# Patient Record
Sex: Female | Born: 1970 | Race: White | Hispanic: No | Marital: Single | Smoking: Never smoker
Health system: Southern US, Community
[De-identification: ages and names within clinical notes are randomized; demographics above are authoritative.]

## PROBLEM LIST (undated history)

## (undated) DIAGNOSIS — R011 Cardiac murmur, unspecified: Secondary | ICD-10-CM

## (undated) DIAGNOSIS — L509 Urticaria, unspecified: Secondary | ICD-10-CM

## (undated) DIAGNOSIS — T783XXA Angioneurotic edema, initial encounter: Secondary | ICD-10-CM

## (undated) DIAGNOSIS — I10 Essential (primary) hypertension: Secondary | ICD-10-CM

## (undated) HISTORY — DX: Cardiac murmur, unspecified: R01.1

## (undated) HISTORY — DX: Angioneurotic edema, initial encounter: T78.3XXA

## (undated) HISTORY — PX: TYMPANOSTOMY TUBE PLACEMENT: SHX32

## (undated) HISTORY — PX: OTHER SURGICAL HISTORY: SHX169

## (undated) HISTORY — DX: Urticaria, unspecified: L50.9

## (undated) HISTORY — DX: Essential (primary) hypertension: I10

---

## 2001-04-23 ENCOUNTER — Inpatient Hospital Stay (HOSPITAL_COMMUNITY): Admission: AD | Admit: 2001-04-23 | Discharge: 2001-04-23 | Payer: Self-pay | Admitting: Obstetrics and Gynecology

## 2001-05-18 ENCOUNTER — Other Ambulatory Visit: Admission: RE | Admit: 2001-05-18 | Discharge: 2001-05-18 | Payer: Self-pay | Admitting: Obstetrics and Gynecology

## 2001-09-17 ENCOUNTER — Ambulatory Visit (HOSPITAL_COMMUNITY): Admission: RE | Admit: 2001-09-17 | Discharge: 2001-09-17 | Payer: Self-pay | Admitting: Obstetrics and Gynecology

## 2001-12-02 ENCOUNTER — Inpatient Hospital Stay (HOSPITAL_COMMUNITY): Admission: AD | Admit: 2001-12-02 | Discharge: 2001-12-05 | Payer: Self-pay | Admitting: Obstetrics and Gynecology

## 2001-12-14 ENCOUNTER — Encounter: Admission: RE | Admit: 2001-12-14 | Discharge: 2002-01-13 | Payer: Self-pay | Admitting: Obstetrics and Gynecology

## 2002-01-04 ENCOUNTER — Other Ambulatory Visit: Admission: RE | Admit: 2002-01-04 | Discharge: 2002-01-04 | Payer: Self-pay | Admitting: Obstetrics and Gynecology

## 2003-02-09 ENCOUNTER — Other Ambulatory Visit: Admission: RE | Admit: 2003-02-09 | Discharge: 2003-02-09 | Payer: Self-pay | Admitting: Obstetrics and Gynecology

## 2004-04-12 ENCOUNTER — Other Ambulatory Visit: Admission: RE | Admit: 2004-04-12 | Discharge: 2004-04-12 | Payer: Self-pay | Admitting: Obstetrics and Gynecology

## 2005-09-01 ENCOUNTER — Other Ambulatory Visit: Admission: RE | Admit: 2005-09-01 | Discharge: 2005-09-01 | Payer: Self-pay | Admitting: Obstetrics and Gynecology

## 2009-10-13 ENCOUNTER — Emergency Department (HOSPITAL_COMMUNITY): Admission: EM | Admit: 2009-10-13 | Discharge: 2009-10-13 | Payer: Self-pay | Admitting: Emergency Medicine

## 2010-08-27 LAB — URINALYSIS, ROUTINE W REFLEX MICROSCOPIC
Ketones, ur: NEGATIVE mg/dL
Leukocytes, UA: NEGATIVE
Nitrite: NEGATIVE
Protein, ur: NEGATIVE mg/dL

## 2010-08-27 LAB — POCT I-STAT, CHEM 8
Chloride: 107 mEq/L (ref 96–112)
Creatinine, Ser: 0.7 mg/dL (ref 0.4–1.2)
Glucose, Bld: 121 mg/dL — ABNORMAL HIGH (ref 70–99)
HCT: 43 % (ref 36.0–46.0)
Potassium: 3.3 mEq/L — ABNORMAL LOW (ref 3.5–5.1)
Sodium: 142 mEq/L (ref 135–145)

## 2012-02-29 ENCOUNTER — Encounter (HOSPITAL_COMMUNITY): Payer: Self-pay | Admitting: *Deleted

## 2012-02-29 ENCOUNTER — Emergency Department (HOSPITAL_COMMUNITY)
Admission: EM | Admit: 2012-02-29 | Discharge: 2012-02-29 | Disposition: A | Discharge status: Home / Self Care | Payer: 59 | Attending: Emergency Medicine | Admitting: Emergency Medicine

## 2012-02-29 DIAGNOSIS — I10 Essential (primary) hypertension: Secondary | ICD-10-CM

## 2012-02-29 DIAGNOSIS — R42 Dizziness and giddiness: Secondary | ICD-10-CM | POA: Insufficient documentation

## 2012-02-29 DIAGNOSIS — R110 Nausea: Secondary | ICD-10-CM | POA: Insufficient documentation

## 2012-02-29 DIAGNOSIS — R209 Unspecified disturbances of skin sensation: Secondary | ICD-10-CM | POA: Insufficient documentation

## 2012-02-29 LAB — COMPREHENSIVE METABOLIC PANEL
ALT: 11 U/L (ref 0–35)
AST: 16 U/L (ref 0–37)
Alkaline Phosphatase: 51 U/L (ref 39–117)
CO2: 22 mEq/L (ref 19–32)
Calcium: 9.2 mg/dL (ref 8.4–10.5)
Chloride: 103 mEq/L (ref 96–112)
GFR calc Af Amer: >90 mL/min (ref >90)
GFR calc non Af Amer: >90 mL/min (ref >90)
Glucose, Bld: 106 mg/dL — ABNORMAL HIGH (ref 70–99)
Sodium: 137 mEq/L (ref 135–145)
Total Bilirubin: 0.3 mg/dL (ref 0.3–1.2)

## 2012-02-29 LAB — CBC WITH DIFFERENTIAL/PLATELET
Eosinophils Relative: 3 % (ref 0–5)
HCT: 40.3 % (ref 36.0–46.0)
Lymphocytes Relative: 29 % (ref 12–46)
Lymphs Abs: 2.5 10*3/uL (ref 0.7–4.0)
MCV: 82.4 fL (ref 78.0–100.0)
Neutro Abs: 5.3 10*3/uL (ref 1.7–7.7)
Platelets: 260 10*3/uL (ref 150–400)
RBC: 4.89 MIL/uL (ref 3.87–5.11)
WBC: 8.6 10*3/uL (ref 4.0–10.5)

## 2012-02-29 LAB — URINALYSIS, ROUTINE W REFLEX MICROSCOPIC
Bilirubin Urine: NEGATIVE
Hgb urine dipstick: NEGATIVE
Ketones, ur: NEGATIVE mg/dL
Nitrite: NEGATIVE
Protein, ur: NEGATIVE mg/dL
Urobilinogen, UA: 0.2 mg/dL (ref 0.0–1.0)

## 2012-02-29 LAB — TROPONIN I: Troponin I: <0.3 ng/mL (ref <0.30)

## 2012-02-29 MED ORDER — MECLIZINE HCL 25 MG PO TABS
25.0000 mg | ORAL_TABLET | Freq: Once | ORAL | Status: AC
  Administered 2012-02-29: 25 mg via ORAL
  Filled 2012-02-29: qty 1

## 2012-02-29 MED ORDER — MECLIZINE HCL 25 MG PO TABS: 25.0000 mg | ORAL_TABLET | Freq: Four times a day (QID) | ORAL | Status: DC

## 2012-02-29 NOTE — ED Notes (Signed)
Pt states that  She woke at 4 am today with dizziness and left arm numbness  B/p is high in triage 192/116  Hr 118

## 2012-02-29 NOTE — ED Notes (Signed)
Pt ambulated independently to D/C window. Shows no signs of acute distress.

## 2012-02-29 NOTE — ED Notes (Signed)
Pt bilateral grips strong.  Pt smile equal symmetrical.  Pt able to ambulate without difficulty.  Pt denies SOB

## 2012-02-29 NOTE — ED Provider Notes (Signed)
History     CSN: [id number]  Arrival date & time 02/29/12  0516   First MD Initiated Contact with Patient 02/29/12 [phone]      Chief Complaint  Patient presents with  . Numbness  . Dizziness  . Nausea    (Consider location/radiation/quality/duration/timing/severity/associated sxs/prior treatment) The history is provided by the patient.   Patient here complaining of dizziness which occurred when she awoke at approximately 4:00 this morning. Denies any chest pain or chest pressure. No recent vomiting or diarrhea. No fevers. Dizziness was worse with certain positions. Denies any ear pain or recent cold symptoms. Also noted some neck pain with radiation to her left arm which was positional. Similar episode earlier this week also worse with movement. No associated anginal type symptoms. No medications used prior to arrival. No past medical history on file.  No past surgical history on file.  No family history on file.  History  Substance Use Topics  . Smoking status: Never Smoker   . Smokeless tobacco: Not on file  . Alcohol Use: No    OB History    Grav Para Term Preterm Abortions TAB SAB Ect Mult Living                  Review of Systems  All other systems reviewed and are negative.    Allergies  Codeine  Home Medications   Current Outpatient Rx  Name Route Sig Dispense Refill  . VITAMIN D 1000 UNITS PO TABS Oral Take 1,000 Units by mouth daily.      BP 140/90  Pulse 80  Temp 98.2 F (36.8 C) (Oral)  Resp 20  Ht 5\' 3"  (1.6 m)  Wt 212 lb (96.163 kg)  BMI 37.55 kg/m2  SpO2 96%  LMP 02/16/2012  Physical Exam  Nursing note and vitals reviewed. Constitutional: She is oriented to person, place, and time. She appears well-developed and well-nourished.  Non-toxic appearance. No distress.  HENT:  Head: Normocephalic and atraumatic.  Eyes: Conjunctivae normal, EOM and lids are normal. Pupils are equal, round, and reactive to light.  Neck: Normal range of  motion. Neck supple. No tracheal deviation present. No mass present.  Cardiovascular: Normal rate, regular rhythm and normal heart sounds.  Exam reveals no gallop.   No murmur heard. Pulmonary/Chest: Effort normal and breath sounds normal. No stridor. No respiratory distress. She has no decreased breath sounds. She has no wheezes. She has no rhonchi. She has no rales.  Abdominal: Soft. Normal appearance and bowel sounds are normal. She exhibits no distension. There is no tenderness. There is no rebound and no CVA tenderness.  Musculoskeletal: Normal range of motion. She exhibits no edema and no tenderness.  Neurological: She is alert and oriented to person, place, and time. She has normal strength. No cranial nerve deficit or sensory deficit. Coordination and gait normal. GCS eye subscore is 4. GCS verbal subscore is 5. GCS motor subscore is 6.  Skin: Skin is warm and dry. No abrasion and no rash noted.  Psychiatric: She has a normal mood and affect. Her speech is normal and behavior is normal.    ED Course  Procedures (including critical care time)  Labs Reviewed  COMPREHENSIVE METABOLIC PANEL - Abnormal; Notable for the following:    Glucose, Bld 106 (*)     All other components within normal limits  TROPONIN I  CBC WITH DIFFERENTIAL  CBC  BASIC METABOLIC PANEL  URINALYSIS, ROUTINE W REFLEX MICROSCOPIC   No results found.  No diagnosis found.    MDM  Pt given anitvert and feels better, pt possibly has peripheral vertigo vs htn-will f/u her pcp for repeat bp checks--neuro exam stable at time of discharge--repeat bp improved        Toy Baker, MD 02/29/12 [phone]

## 2013-03-04 ENCOUNTER — Other Ambulatory Visit: Payer: Self-pay | Admitting: Obstetrics and Gynecology

## 2013-03-04 DIAGNOSIS — R928 Other abnormal and inconclusive findings on diagnostic imaging of breast: Secondary | ICD-10-CM

## 2013-03-15 ENCOUNTER — Ambulatory Visit
Admission: RE | Admit: 2013-03-15 | Discharge: 2013-03-15 | Disposition: A | Discharge status: Home / Self Care | Payer: 59 | Source: Ambulatory Visit | Attending: Obstetrics and Gynecology | Admitting: Obstetrics and Gynecology

## 2013-03-15 DIAGNOSIS — R928 Other abnormal and inconclusive findings on diagnostic imaging of breast: Secondary | ICD-10-CM

## 2014-02-21 ENCOUNTER — Other Ambulatory Visit: Payer: Self-pay | Admitting: Obstetrics and Gynecology

## 2014-02-21 DIAGNOSIS — R921 Mammographic calcification found on diagnostic imaging of breast: Secondary | ICD-10-CM

## 2014-03-16 ENCOUNTER — Ambulatory Visit
Admission: RE | Admit: 2014-03-16 | Discharge: 2014-03-16 | Disposition: A | Discharge status: Home / Self Care | Payer: 59 | Source: Ambulatory Visit | Attending: Obstetrics and Gynecology | Admitting: Obstetrics and Gynecology

## 2014-03-16 ENCOUNTER — Encounter (INDEPENDENT_AMBULATORY_CARE_PROVIDER_SITE_OTHER): Payer: Self-pay

## 2014-03-16 DIAGNOSIS — R921 Mammographic calcification found on diagnostic imaging of breast: Secondary | ICD-10-CM

## 2015-02-15 ENCOUNTER — Other Ambulatory Visit: Payer: Self-pay | Admitting: Obstetrics and Gynecology

## 2015-02-15 DIAGNOSIS — R921 Mammographic calcification found on diagnostic imaging of breast: Secondary | ICD-10-CM

## 2015-03-29 ENCOUNTER — Ambulatory Visit
Admission: RE | Admit: 2015-03-29 | Discharge: 2015-03-29 | Disposition: A | Discharge status: Home / Self Care | Payer: 59 | Source: Ambulatory Visit | Attending: Obstetrics and Gynecology | Admitting: Obstetrics and Gynecology

## 2015-03-29 DIAGNOSIS — R921 Mammographic calcification found on diagnostic imaging of breast: Secondary | ICD-10-CM

## 2015-07-06 ENCOUNTER — Ambulatory Visit (INDEPENDENT_AMBULATORY_CARE_PROVIDER_SITE_OTHER): Payer: Worker's Compensation | Admitting: Urgent Care

## 2015-07-06 ENCOUNTER — Ambulatory Visit: Payer: Worker's Compensation

## 2015-07-06 VITALS — BP 156/96 | HR 79 | Temp 98.1°F | Resp 16 | Ht 63.75 in | Wt 137.8 lb

## 2015-07-06 DIAGNOSIS — M79661 Pain in right lower leg: Secondary | ICD-10-CM

## 2015-07-06 DIAGNOSIS — R030 Elevated blood-pressure reading, without diagnosis of hypertension: Secondary | ICD-10-CM | POA: Diagnosis not present

## 2015-07-06 DIAGNOSIS — S86811A Strain of other muscle(s) and tendon(s) at lower leg level, right leg, initial encounter: Secondary | ICD-10-CM | POA: Diagnosis not present

## 2015-07-06 MED ORDER — CYCLOBENZAPRINE HCL 5 MG PO TABS: 5.0000 mg | ORAL_TABLET | Freq: Three times a day (TID) | ORAL | Status: DC | PRN

## 2015-07-06 MED ORDER — NAPROXEN SODIUM 550 MG PO TABS: 550.0000 mg | ORAL_TABLET | Freq: Two times a day (BID) | ORAL | Status: DC

## 2015-07-06 NOTE — Progress Notes (Signed)
    MRN: [id number] DOB: Jul 12, 1970  Subjective:   Ashley Turner is a 45 y.o. female presenting for chief complaint of Leg Pain  Reports right injury while at work today. Patient works in Scientist, research (medical), Statistician. She reports that she stepped off the curb while receiving a truck shipment. Patient felt tear and pulling in her right calf. She has since had throbbing pain in her calf up to her thigh, is worse with walking, has been limping. Denies swelling, redness, hearing loud popping noise. Denies smoking cigarettes.  Madelene's medications list, allergies, past medical history, past surgical history, family history were reviewed and excluded from this note due to being a worker's comp case.  Objective:   Vitals: BP 156/96 mmHg  Pulse 79  Temp(Src) 98.1 F (36.7 C) (Oral)  Resp 16  Ht 5' 3.75" (1.619 m)  Wt 137 lb 12.8 oz (62.506 kg)  BMI 23.85 kg/m2  SpO2 98%  LMP 06/20/2015  Physical Exam  Constitutional: She is oriented to person, place, and time. She appears well-developed and well-nourished.  HENT:  Mouth/Throat: Oropharynx is clear and moist.  Eyes: Right eye exhibits no discharge. Left eye exhibits no discharge. No scleral icterus.  Neck: Normal range of motion. Neck supple. No thyromegaly present.  Cardiovascular: Normal rate, regular rhythm and intact distal pulses.  Exam reveals no gallop and no friction rub.   No murmur heard. Pulmonary/Chest: No respiratory distress. She has no wheezes. She has no rales.  Musculoskeletal: She exhibits tenderness (over right calf with deep palpation). She exhibits no edema.       Right knee: She exhibits normal range of motion, no swelling, no effusion, no ecchymosis, no deformity, no erythema, no LCL laxity and normal patellar mobility. No tenderness found.  Neurological: She is alert and oriented to person, place, and time. She has normal reflexes. Coordination (limping, favoring her right leg) abnormal.  Skin: Skin is warm and dry. No  rash noted. No erythema. No pallor.  Psychiatric: She has a normal mood and affect.   Dg Tibia/fibula Right  07/06/2015  CLINICAL DATA:  Right lower leg pain common no known injury EXAM: RIGHT TIBIA AND FIBULA - 2 VIEW COMPARISON:  None in PACs FINDINGS: The right tibia and fibula are adequately mineralized. There is no lytic or blastic lesion. There is no acute or healing fracture. The overlying soft tissues exhibit no abnormalities. The observed portions of the right knee in the right ankle are normal. IMPRESSION: There is no acute or chronic bony abnormality of the right tibia or fibula. The overlying soft tissues are unremarkable. Electronically Signed   By: David  Martinique M.D.   On: 07/06/2015 11:06   Assessment and Plan :   1. Strain of calf muscle, right, initial encounter 2. Pain of right lower leg - X-ray findings and physical exam reassuring. Low suspicion of DVT. Will start conservative management with Anaprox and Flexeril. Patient to rtc in 2 weeks if no improvement, consider referral to ortho at that point.  3. Elevated blood pressure reading without diagnosis of hypertension - Recommended patient set up annual exam or at the very least a recheck for her elevated blood pressure reading to r/o HTN. ROS is negative including lightheadedness, dizziness, chronic headache, double vision, chest pain, shortness of breath, heart racing, palpitations, nausea, vomiting, abdominal pain, hematuria.   Jaynee Eagles, PA-C [facility] [phone] 07/06/2015 10:44 AM

## 2015-07-06 NOTE — Patient Instructions (Addendum)
Because you received an x-ray today, you will receive an invoice from [facility]. Please contact [facility] at [phone] with questions or concerns regarding your invoice. Our billing staff will not be able to assist you with those questions.    Muscle Strain A muscle strain is an injury that occurs when a muscle is stretched beyond its normal length. Usually a small number of muscle fibers are torn when this happens. Muscle strain is rated in degrees. First-degree strains have the least amount of muscle fiber tearing and pain. Second-degree and third-degree strains have increasingly more tearing and pain.  Usually, recovery from muscle strain takes 1-2 weeks. Complete healing takes 5-6 weeks.  CAUSES  Muscle strain happens when a sudden, violent force placed on a muscle stretches it too far. This may occur with lifting, sports, or a fall.  RISK FACTORS Muscle strain is especially common in athletes.  SIGNS AND SYMPTOMS At the site of the muscle strain, there may be:  Pain.  Bruising.  Swelling.  Difficulty using the muscle due to pain or lack of normal function. DIAGNOSIS  Your health care provider will perform a physical exam and ask about your medical history. TREATMENT  Often, the best treatment for a muscle strain is resting, icing, and applying cold compresses to the injured area.  HOME CARE INSTRUCTIONS   Use the PRICE method of treatment to promote muscle healing during the first 2-3 days after your injury. The PRICE method involves:  Protecting the muscle from being injured again.  Restricting your activity and resting the injured body part.  Icing your injury. To do this, put ice in a plastic bag. Place a towel between your skin and the bag. Then, apply the ice and leave it on from 15-20 minutes each hour. After the third day, switch to moist heat packs.  Apply compression to the injured area with a splint or elastic bandage. Be careful not to wrap  it too tightly. This may interfere with blood circulation or increase swelling.  Elevate the injured body part above the level of your heart as often as you can.  Only take over-the-counter or prescription medicines for pain, discomfort, or fever as directed by your health care provider.  Warming up prior to exercise helps to prevent future muscle strains. SEEK MEDICAL CARE IF:   You have increasing pain or swelling in the injured area.  You have numbness, tingling, or a significant loss of strength in the injured area. MAKE SURE YOU:   Understand these instructions.  Will watch your condition.  Will get help right away if you are not doing well or get worse.   This information is not intended to replace advice given to you by your health care provider. Make sure you discuss any questions you have with your health care provider.   Document Released: 05/26/2005 Document Revised: 03/16/2013 Document Reviewed: 12/23/2012 Elsevier Interactive Patient Education [employer].

## 2015-09-04 ENCOUNTER — Ambulatory Visit (INDEPENDENT_AMBULATORY_CARE_PROVIDER_SITE_OTHER): Payer: 59 | Admitting: Family Medicine

## 2015-09-04 VITALS — BP 160/94 | HR 80 | Temp 97.7°F | Resp 20 | Ht 64.57 in | Wt 140.2 lb

## 2015-09-04 DIAGNOSIS — I10 Essential (primary) hypertension: Secondary | ICD-10-CM

## 2015-09-04 MED ORDER — AMLODIPINE BESYLATE 5 MG PO TABS: 5.0000 mg | ORAL_TABLET | Freq: Every day | ORAL | Status: DC

## 2015-09-04 NOTE — Progress Notes (Signed)
This is a 45 year old woman who works for a [employer] education [employer]), and is returning to have her blood pressure rechecked. She had a calf problem a couple months ago which is healing. She hasn't returned to running yet.   Patient has stopped taking Naprosyn. She has a family history of hypertension with her sister and both parents. She has no symptoms of hypertension such as headache, chest pain, or swelling in the legs.  Patient has had lab tests in the past which have been normal.  Objective:BP 160/94 mmHg  Pulse 80  Temp(Src) 97.7 F (36.5 C) (Oral)  Resp 20  Ht 5' 4.57" (1.64 m)  Wt 140 lb 3.2 oz (63.594 kg)  BMI 23.64 kg/m2  SpO2 98%  LMP 08/20/2015 Blood pressure recheck is 164/94 Heart: Regular, no murmur or gallop Chest: Clear Extremity: No edema  Assessment: Essential hypertension   This chart was scribed in my presence and reviewed by me personally.    ICD-9-CM ICD-10-CM   1. Essential hypertension 401.9 I10 amLODipine (NORVASC) 5 MG tablet     Signed, Robyn Haber, MD

## 2015-09-04 NOTE — Patient Instructions (Addendum)
Monitor your blood pressure. Our goal is to get your blood pressure below 140/90. He will get lightheaded if your blood pressure gets below 90/60. If that happens, discontinue the medication and return for consideration of another blood pressure medicine. He usually takes one month for the blood pressure to completely returned to normal after starting amlodipine     Hypertension Hypertension, commonly called high blood pressure, is when the force of blood pumping through your arteries is too strong. Your arteries are the blood vessels that carry blood from your heart throughout your body. A blood pressure reading consists of a higher number over a lower number, such as 110/72. The higher number (systolic) is the pressure inside your arteries when your heart pumps. The lower number (diastolic) is the pressure inside your arteries when your heart relaxes. Ideally you want your blood pressure below 120/80. Hypertension forces your heart to work harder to pump blood. Your arteries may become narrow or stiff. Having untreated or uncontrolled hypertension can cause heart attack, stroke, kidney disease, and other problems. RISK FACTORS Some risk factors for high blood pressure are controllable. Others are not.  Risk factors you cannot control include:   Race. You may be at higher risk if you are African American.  Age. Risk increases with age.  Gender. Men are at higher risk than women before age 42 years. After age 65, women are at higher risk than men. Risk factors you can control include:  Not getting enough exercise or physical activity.  Being overweight.  Getting too much fat, sugar, calories, or salt in your diet.  Drinking too much alcohol. SIGNS AND SYMPTOMS Hypertension does not usually cause signs or symptoms. Extremely high blood pressure (hypertensive crisis) may cause headache, anxiety, shortness of breath, and nosebleed. DIAGNOSIS To check if you have hypertension, your health  care provider will measure your blood pressure while you are seated, with your arm held at the level of your heart. It should be measured at least twice using the same arm. Certain conditions can cause a difference in blood pressure between your right and left arms. A blood pressure reading that is higher than normal on one occasion does not mean that you need treatment. If it is not clear whether you have high blood pressure, you may be asked to return on a different day to have your blood pressure checked again. Or, you may be asked to monitor your blood pressure at home for 1 or more weeks. TREATMENT Treating high blood pressure includes making lifestyle changes and possibly taking medicine. Living a healthy lifestyle can help lower high blood pressure. You may need to change some of your habits. Lifestyle changes may include:  Following the DASH diet. This diet is high in fruits, vegetables, and whole grains. It is low in salt, red meat, and added sugars.  Keep your sodium intake below 2,300 mg per day.  Getting at least 30-45 minutes of aerobic exercise at least 4 times per week.  Losing weight if necessary.  Not smoking.  Limiting alcoholic beverages.  Learning ways to reduce stress. Your health care provider may prescribe medicine if lifestyle changes are not enough to get your blood pressure under control, and if one of the following is true:  You are 53-75 years of age and your systolic blood pressure is above 140.  You are 53 years of age or older, and your systolic blood pressure is above 150.  Your diastolic blood pressure is above 90.  You have  diabetes, and your systolic blood pressure is over [id number] or your diastolic blood pressure is over 90.  You have kidney disease and your blood pressure is above 140/90.  You have heart disease and your blood pressure is above 140/90. Your personal target blood pressure may vary depending on your medical conditions, your age, and other  factors. HOME CARE INSTRUCTIONS  Have your blood pressure rechecked as directed by your health care provider.   Take medicines only as directed by your health care provider. Follow the directions carefully. Blood pressure medicines must be taken as prescribed. The medicine does not work as well when you skip doses. Skipping doses also puts you at risk for problems.  Do not smoke.   Monitor your blood pressure at home as directed by your health care provider. SEEK MEDICAL CARE IF:   You think you are having a reaction to medicines taken.  You have recurrent headaches or feel dizzy.  You have swelling in your ankles.  You have trouble with your vision. SEEK IMMEDIATE MEDICAL CARE IF:  You develop a severe headache or confusion.  You have unusual weakness, numbness, or feel faint.  You have severe chest or abdominal pain.  You vomit repeatedly.  You have trouble breathing. MAKE SURE YOU:   Understand these instructions.  Will watch your condition.  Will get help right away if you are not doing well or get worse.   This information is not intended to replace advice given to you by your health care provider. Make sure you discuss any questions you have with your health care provider.   Document Released: 05/26/2005 Document Revised: 10/10/2014 Document Reviewed: 03/18/2013 Elsevier Interactive Patient Education [employer].

## 2016-07-01 DIAGNOSIS — Z01419 Encounter for gynecological examination (general) (routine) without abnormal findings: Secondary | ICD-10-CM | POA: Diagnosis not present

## 2016-08-02 ENCOUNTER — Other Ambulatory Visit: Payer: Self-pay | Admitting: Family Medicine

## 2016-08-02 DIAGNOSIS — I10 Essential (primary) hypertension: Secondary | ICD-10-CM

## 2016-08-04 NOTE — Telephone Encounter (Signed)
Meds ordered this encounter  Medications  . amLODipine (NORVASC) 5 MG tablet    Sig: TAKE 1 TABLET (5 MG TOTAL) BY MOUTH DAILY.    Dispense:  90 tablet    Refill:  0    Patient notified via My Chart. Needs OV for next fill.

## 2016-11-03 ENCOUNTER — Other Ambulatory Visit: Payer: Self-pay | Admitting: Physician Assistant

## 2016-11-03 DIAGNOSIS — I10 Essential (primary) hypertension: Secondary | ICD-10-CM

## 2016-11-07 ENCOUNTER — Encounter: Payer: Self-pay | Admitting: Family Medicine

## 2016-11-07 ENCOUNTER — Ambulatory Visit (INDEPENDENT_AMBULATORY_CARE_PROVIDER_SITE_OTHER): Payer: 59 | Admitting: Family Medicine

## 2016-11-07 VITALS — BP 138/90 | HR 73 | Temp 98.8°F | Resp 16 | Ht 63.78 in | Wt 154.6 lb

## 2016-11-07 DIAGNOSIS — I10 Essential (primary) hypertension: Secondary | ICD-10-CM | POA: Diagnosis not present

## 2016-11-07 DIAGNOSIS — Z5181 Encounter for therapeutic drug level monitoring: Secondary | ICD-10-CM

## 2016-11-07 DIAGNOSIS — E663 Overweight: Secondary | ICD-10-CM

## 2016-11-07 DIAGNOSIS — Z13220 Encounter for screening for lipoid disorders: Secondary | ICD-10-CM

## 2016-11-07 DIAGNOSIS — Z131 Encounter for screening for diabetes mellitus: Secondary | ICD-10-CM | POA: Diagnosis not present

## 2016-11-07 MED ORDER — AMLODIPINE BESYLATE 5 MG PO TABS: 5.0000 mg | ORAL_TABLET | Freq: Every day | ORAL | 1 refills | Status: DC

## 2016-11-07 NOTE — Progress Notes (Signed)
  Chief Complaint  Patient presents with  . Medication Refill    amlodipine    HPI   Hypertension: Patient here for follow-up of elevated blood pressure. She is not exercising and is adherent to low salt diet.  Blood pressure is not checked at home. Cardiac symptoms none. Patient denies chest pain, dyspnea, fatigue, irregular heart beat, near-syncope and palpitations.  Cardiovascular risk factors: hypertension. Use of agents associated with hypertension: none. History of target organ damage: none.  She has been diagnosed with hypertension March 2017. BP Readings from Last 3 Encounters:  11/07/16 (!) 148/88  09/04/15 (!) 160/94  07/06/15 (!) 156/96    Lab Results  Component Value Date   CREATININE 0.68 02/29/2012   Screening for dyslipidemia She has a strong history of high cholesterol in the family She does not exercise She does not take any fish oil supplements No chest pains   Overweight She is not exercising She would like to lose some weight She is interested in a weight loss program but does not have time Body mass index is 26.72 kg/m. Wt Readings from Last 3 Encounters:  11/07/16 154 lb 9.6 oz (70.1 kg)  09/04/15 140 lb 3.2 oz (63.6 kg)  07/06/15 137 lb 12.8 oz (62.5 kg)     No past medical history on file.  Current Outpatient Prescriptions  Medication Sig Dispense Refill  . amLODipine (NORVASC) 5 MG tablet TAKE 1 TABLET (5 MG TOTAL) BY MOUTH DAILY. 90 tablet 0  . Norethin Ace-Eth Estrad-FE (TAYTULLA) 1-20 MG-MCG(24) CAPS Take by mouth.     No current facility-administered medications for this visit.     Allergies:  Allergies  Allergen Reactions  . Codeine Nausea And Vomiting    No past surgical history on file.  Social History   Social History  . Marital status: Single    Spouse name: N/A  . Number of children: N/A  . Years of education: N/A   Social History Main Topics  . Smoking status: Never Smoker  . Smokeless tobacco: Never Used  .  Alcohol use No  . Drug use: No  . Sexual activity: Not on file   Other Topics Concern  . Not on file   Social History Narrative  . No narrative on file    ROS See hpi  Objective: Vitals:   11/07/16 0822  BP: (!) 148/88  Pulse: 73  Resp: 16  Temp: 98.8 F (37.1 C)  TempSrc: Oral  SpO2: 98%  Weight: 154 lb 9.6 oz (70.1 kg)  Height: 5' 3.78" (1.62 m)    Physical Exam  Constitutional: She is oriented to person, place, and time. She appears well-developed and well-nourished.  HENT:  Head: Normocephalic and atraumatic.  Eyes: Conjunctivae and EOM are normal.  Cardiovascular: Normal rate, regular rhythm and normal heart sounds.   Pulmonary/Chest: Effort normal and breath sounds normal. No respiratory distress. She has no wheezes.  Neurological: She is alert and oriented to person, place, and time.  Skin: Skin is warm. Capillary refill takes less than 2 seconds.  Psychiatric: She has a normal mood and affect. Her behavior is normal. Judgment and thought content normal.    Assessment and Plan There are no diagnoses linked to this encounter.   [place]

## 2016-11-07 NOTE — Patient Instructions (Addendum)
How to Take Your Blood Pressure  Blood pressure is a measurement of how strongly your blood is pressing against the walls of your arteries. Arteries are blood vessels that carry blood from your heart throughout your body. Your health care provider takes your blood pressure at each office visit. You can also take your own blood pressure at home with a blood pressure machine. You may need to take your own blood pressure:   To confirm a diagnosis of high blood pressure (hypertension).   To monitor your blood pressure over time.   To make sure your blood pressure medicine is working.    Supplies needed:  To take your blood pressure, you will need a blood pressure machine. You can buy a blood pressure machine, or blood pressure monitor, at most drugstores or online. There are several types of home blood pressure monitors. When choosing one, consider the following:   Choose a monitor that has an arm cuff.   Choose a monitor that wraps snugly around your upper arm. You should be able to fit only one finger between your arm and the cuff.   Do not choose a monitor that measures your blood pressure from your wrist or finger.    Your health care provider can suggest a reliable monitor that will meet your needs.  How to prepare  To get the most accurate reading, avoid the following for 30 minutes before you check your blood pressure:   Drinking caffeine.   Drinking alcohol.   Eating.   Smoking.   Exercising.    Five minutes before you check your blood pressure:   Empty your bladder.   Sit quietly without talking in a dining chair, rather than in a soft couch or armchair.    How to take your blood pressure  To check your blood pressure, follow the instructions in the manual that came with your blood pressure monitor. If you have a digital blood pressure monitor, the instructions may be as follows:  1. Sit up straight.  2. Place your feet on the floor. Do not cross your ankles or legs.  3. Rest your left arm at the  level of your heart on a table or desk or on the arm of a chair.  4. Pull up your shirt sleeve.  5. Wrap the blood pressure cuff around the upper part of your left arm, 1 inch (2.5 cm) above your elbow. It is best to wrap the cuff around bare skin.  6. Fit the cuff snugly around your arm. You should be able to place only one finger between the cuff and your arm.  7. Position the cord inside the groove of your elbow.  8. Press the power button.  9. Sit quietly while the cuff inflates and deflates.  10. Read the digital reading on the monitor screen and write it down (record it).  11. Wait 2-3 minutes, then repeat the steps, starting at step 1.    What does my blood pressure reading mean?  A blood pressure reading consists of a higher number over a lower number. Ideally, your blood pressure should be below 120/80. The first ("top") number is called the systolic pressure. It is a measure of the pressure in your arteries as your heart beats. The second ("bottom") number is called the diastolic pressure. It is a measure of the pressure in your arteries as the heart relaxes.  Blood pressure is classified into four stages. The following are the stages for adults who do   not have a short-term serious illness or a chronic condition. Systolic pressure and diastolic pressure are measured in a unit called mm Hg.  Normal   Systolic pressure: below 120.   Diastolic pressure: below 80.  Elevated   Systolic pressure: 120-129.   Diastolic pressure: below 80.  Hypertension stage 1   Systolic pressure: 130-139.   Diastolic pressure: 80-89.  Hypertension stage 2   Systolic pressure: 140 or above.   Diastolic pressure: 90 or above.  You can have prehypertension or hypertension even if only the systolic or only the diastolic number in your reading is higher than normal.  Follow these instructions at home:   Check your blood pressure as often as recommended by your health care provider.   Take your monitor to the next appointment  with your health care provider to make sure:  ? That you are using it correctly.  ? That it provides accurate readings.   Be sure you understand what your goal blood pressure numbers are.   Tell your health care provider if you are having any side effects from blood pressure medicine.  Contact a health care provider if:   Your blood pressure is consistently high.  Get help right away if:   Your systolic blood pressure is higher than 180.   Your diastolic blood pressure is higher than 110.  This information is not intended to replace advice given to you by your health care provider. Make sure you discuss any questions you have with your health care provider.  Document Released: 11/02/2015 Document Revised: 01/15/2016 Document Reviewed: 11/02/2015  Elsevier Interactive Patient Education  2018 [employer] Inc[employer]

## 2016-11-08 LAB — LIPID PANEL
CHOLESTEROL TOTAL: 199 mg/dL (ref 100–199)
Chol/HDL Ratio: 3 ratio (ref 0.0–4.4)
HDL: 67 mg/dL (ref >39)
LDL Calculated: 121 mg/dL — ABNORMAL HIGH (ref 0–99)
TRIGLYCERIDES: 57 mg/dL (ref 0–149)
VLDL Cholesterol Cal: 11 mg/dL (ref 5–40)

## 2016-11-08 LAB — COMPREHENSIVE METABOLIC PANEL
A/G RATIO: 1.9 (ref 1.2–2.2)
ALK PHOS: 34 IU/L — AB (ref 39–117)
ALT: 8 IU/L (ref 0–32)
AST: 11 IU/L (ref 0–40)
Albumin: 4.3 g/dL (ref 3.5–5.5)
BILIRUBIN TOTAL: 0.4 mg/dL (ref 0.0–1.2)
BUN/Creatinine Ratio: 20 (ref 9–23)
BUN: 15 mg/dL (ref 6–24)
CHLORIDE: 105 mmol/L (ref 96–106)
CO2: 23 mmol/L (ref 18–29)
Calcium: 9.9 mg/dL (ref 8.7–10.2)
Creatinine, Ser: 0.76 mg/dL (ref 0.57–1.00)
GFR calc Af Amer: 110 mL/min/{1.73_m2} (ref >59)
GFR calc non Af Amer: 95 mL/min/{1.73_m2} (ref >59)
Globulin, Total: 2.3 g/dL (ref 1.5–4.5)
Glucose: 77 mg/dL (ref 65–99)
POTASSIUM: 4.2 mmol/L (ref 3.5–5.2)
Sodium: 141 mmol/L (ref 134–144)
Total Protein: 6.6 g/dL (ref 6.0–8.5)

## 2016-11-08 LAB — MICROALBUMIN, URINE: Microalbumin, Urine: 6.6 ug/mL

## 2016-11-08 LAB — HEMOGLOBIN A1C
Est. average glucose Bld gHb Est-mCnc: 103 mg/dL
Hgb A1c MFr Bld: 5.2 % (ref 4.8–5.6)

## 2017-02-17 DIAGNOSIS — Z719 Counseling, unspecified: Secondary | ICD-10-CM | POA: Diagnosis not present

## 2017-05-04 ENCOUNTER — Other Ambulatory Visit: Payer: Self-pay | Admitting: Family Medicine

## 2017-05-04 DIAGNOSIS — I10 Essential (primary) hypertension: Secondary | ICD-10-CM

## 2017-07-02 DIAGNOSIS — Z01419 Encounter for gynecological examination (general) (routine) without abnormal findings: Secondary | ICD-10-CM | POA: Diagnosis not present

## 2017-07-02 DIAGNOSIS — Z1212 Encounter for screening for malignant neoplasm of rectum: Secondary | ICD-10-CM | POA: Diagnosis not present

## 2017-07-02 DIAGNOSIS — Z6827 Body mass index (BMI) 27.0-27.9, adult: Secondary | ICD-10-CM | POA: Diagnosis not present

## 2017-08-02 ENCOUNTER — Other Ambulatory Visit: Payer: Self-pay | Admitting: Physician Assistant

## 2017-08-02 DIAGNOSIS — I10 Essential (primary) hypertension: Secondary | ICD-10-CM

## 2017-08-03 NOTE — Telephone Encounter (Signed)
Attempted to contact pt regarding prescription refill request; office visit needed for additional refills; no upcoming appointments noted; left message at [phone].

## 2017-08-06 ENCOUNTER — Ambulatory Visit: Payer: 59 | Admitting: Physician Assistant

## 2017-08-06 ENCOUNTER — Other Ambulatory Visit: Payer: Self-pay

## 2017-08-06 ENCOUNTER — Encounter: Payer: Self-pay | Admitting: Physician Assistant

## 2017-08-06 VITALS — BP 132/98 | HR 88 | Temp 98.4°F | Resp 16 | Ht 63.78 in | Wt 161.0 lb

## 2017-08-06 DIAGNOSIS — I10 Essential (primary) hypertension: Secondary | ICD-10-CM | POA: Diagnosis not present

## 2017-08-06 DIAGNOSIS — H8110 Benign paroxysmal vertigo, unspecified ear: Secondary | ICD-10-CM | POA: Diagnosis not present

## 2017-08-06 DIAGNOSIS — R011 Cardiac murmur, unspecified: Secondary | ICD-10-CM | POA: Diagnosis not present

## 2017-08-06 MED ORDER — AMLODIPINE BESYLATE 10 MG PO TABS: 10.0000 mg | ORAL_TABLET | Freq: Every day | ORAL | 3 refills | Status: DC

## 2017-08-06 NOTE — Patient Instructions (Signed)
     IF you received an x-ray today, you will receive an invoice from [facility]. Please contact [facility] at [phone] with questions or concerns regarding your invoice.   IF you received labwork today, you will receive an invoice from [facility]. Please contact [facility] at [phone] with questions or concerns regarding your invoice.   Our billing staff will not be able to assist you with questions regarding bills from these companies.  You will be contacted with the lab results as soon as they are available. The fastest way to get your results is to activate your My Chart account. Instructions are located on the last page of this paperwork. If you have not heard from us regarding the results in 2 weeks, please contact this office.     

## 2017-08-06 NOTE — Assessment & Plan Note (Signed)
New diagnosis today, patient not previously aware of any murmur. Recommend echocardiogram.

## 2017-08-06 NOTE — Assessment & Plan Note (Signed)
DBP remains elevated today. INCREASE amlodipine from 5 mg to 10 mg. Monitor BP at home, especially if dizziness recurs.

## 2017-08-06 NOTE — Progress Notes (Signed)
Patient ID: Ashley Turner, female    DOB: Jan 31, 1971, 47 y.o.   MRN: [id number]  PCP: Forrest Moron, MD  Chief Complaint  Patient presents with  . Medication Refill    Norvasc     Subjective:   Presents for evaluation of HTN.  Tolerating amlodipine well, without adverse effects. Notes a couple of episodes of dizziness in last week. Both associated with rapid position change (leaned over and stood up quickly, rolled over in bed). Both episodes resolved without treatment.  Declines HIV screening and Tdap today.  Review of Systems  Constitutional: Negative for chills and fever.  Respiratory: Negative for cough and shortness of breath.   Cardiovascular: Negative for chest pain, palpitations and leg swelling.  Gastrointestinal: Negative for diarrhea, nausea and vomiting.  Endocrine: Negative for polydipsia.  Genitourinary: Negative for dysuria, frequency and urgency.  Musculoskeletal: Negative for myalgias.  Skin: Negative for rash.  Neurological: Positive for dizziness. Negative for tremors, seizures, syncope, facial asymmetry, speech difficulty, weakness, light-headedness, numbness and headaches.       Patient Active Problem List   Diagnosis Date Noted  . Essential hypertension 09/04/2015     Prior to Admission medications   Medication Sig Start Date End Date Taking? Authorizing Provider  amLODipine (NORVASC) 5 MG tablet Take 1 tablet (5 mg total) by mouth daily. Office visit needed 05/04/17  Yes Keyston Ardolino, PA-C  BLISOVI 24 FE 1-20 MG-MCG(24) tablet  06/17/17  Yes [provider]     Allergies  Allergen Reactions  . Codeine Nausea And Vomiting       Objective:  Physical Exam  Constitutional: She is oriented to person, place, and time. She appears well-developed and well-nourished. She is active and cooperative. No distress.  BP (!) 132/98   Pulse 88   Temp 98.4 F (36.9 C)   Resp 16   Ht 5' 3.78" (1.62 m)   Wt 161 lb (73 kg)   SpO2  98%   BMI 27.83 kg/m   HENT:  Head: Normocephalic and atraumatic.  Right Ear: Hearing normal.  Left Ear: Hearing normal.  Eyes: Conjunctivae are normal. No scleral icterus.  Neck: Normal range of motion, full passive range of motion without pain and phonation normal. Neck supple. Carotid bruit is not present. No thyromegaly present.  Cardiovascular: Normal rate, regular rhythm and intact distal pulses.  Murmur heard.  Systolic murmur is present with a grade of 2/6. Pulses:      Radial pulses are 2+ on the right side, and 2+ on the left side.  Pulmonary/Chest: Effort normal and breath sounds normal.  Lymphadenopathy:       Head (right side): No tonsillar, no preauricular, no posterior auricular and no occipital adenopathy present.       Head (left side): No tonsillar, no preauricular, no posterior auricular and no occipital adenopathy present.    She has no cervical adenopathy.       Right: No supraclavicular adenopathy present.       Left: No supraclavicular adenopathy present.  Neurological: She is alert and oriented to person, place, and time. No sensory deficit.  Skin: Skin is warm, dry and intact. No rash noted. No cyanosis or erythema. Nails show no clubbing.  Psychiatric: She has a normal mood and affect. Her speech is normal and behavior is normal.        Assessment & Plan:   Problem List Items Addressed This Visit    Essential hypertension - Primary  DBP remains elevated today. INCREASE amlodipine from 5 mg to 10 mg. Monitor BP at home, especially if dizziness recurs.      Relevant Medications   amLODipine (NORVASC) 10 MG tablet   Cardiac murmur    New diagnosis today, patient not previously aware of any murmur. Recommend echocardiogram.      Relevant Orders   Ambulatory referral to Cardiology    Other Visit Diagnoses    Benign paroxysmal positional vertigo, unspecified laterality       if symptoms become more frequent, recommend additional evaluation.        Return in about 6 weeks (around 09/17/2017) for re-evalaution of BP with Dr. Nolon Rod.   Fara Chute, PA-C [facility]

## 2017-08-23 NOTE — Progress Notes (Signed)
Cardiology Office Note   Date:  08/24/2017   ID:  [name], DOB December 26, 1970, MRN [id number]  PCP:  Forrest Moron, MD  Cardiologist:  Skeet Latch MD Chief Complaint  Patient presents with  . New Patient (Initial Visit)  . Heart Murmur     History of Present Illness: Ashley Turner is a 47 y.o. female who presents at the request of Chelle Jefferies. PA and Dr. Nolon Rod of [facility] for evaluation of heart murmur, with hx of HTN, dizziness and near syncope.     Past Medical History:  Diagnosis Date  . Heart murmur   . HTN (hypertension)     Past Surgical History:  Procedure Laterality Date  . Tubes in ears Bilateral      Current Outpatient Medications  Medication Sig Dispense Refill  . amLODipine (NORVASC) 10 MG tablet Take 1 tablet (10 mg total) by mouth daily. Office visit needed 90 tablet 3  . BLISOVI 24 FE 1-20 MG-MCG(24) tablet      No current facility-administered medications for this visit.     Allergies:   Codeine    Social History:  The patient  reports that  has never smoked. she has never used smokeless tobacco. She reports that she drinks about 1.2 oz of alcohol per week. She reports that she does not use drugs.   Family History:  The patient's family history includes Cancer in her father; Hyperlipidemia in her mother; Hypertension in her father, mother, and sister; Stroke in her father.    ROS: All other systems are reviewed and negative. Unless otherwise mentioned in H&P    PHYSICAL EXAM: VS:  BP (!) 132/92 (BP Location: Left Arm)   Pulse 74   Ht 5\' 3"  (1.6 m)   Wt 161 lb 3.2 oz (73.1 kg)   BMI 28.56 kg/m  , BMI Body mass index is 28.56 kg/m. GEN: Well nourished, well developed, in no acute distress  HEENT: normal  Neck: no JVD, carotid bruits, or masses Cardiac: RRR; soft systolic murmur  Heard best at the apex, rubs, or gallops,no edema  Respiratory:  cCear to auscultation bilaterally, normal work of breathing GI:  soft, nontender, nondistended, + BS MS: no deformity or atrophy  Skin: warm and dry, no rash Neuro:  Strength and sensation are intact Psych: euthymic mood, full affect   EKG:  NSR rate of 74 bpm  Recent Labs: 11/07/2016: ALT 8; BUN 15; Creatinine, Ser 0.76; Potassium 4.2; Sodium 141    Lipid Panel    Component Value Date/Time   CHOL 199 11/07/2016 0837   TRIG 57 11/07/2016 0837   HDL 67 11/07/2016 0837   CHOLHDL 3.0 11/07/2016 0837   LDLCALC 121 (H) 11/07/2016 0837      Wt Readings from Last 3 Encounters:  08/24/17 161 lb 3.2 oz (73.1 kg)  08/06/17 161 lb (73 kg)  11/07/16 154 lb 9.6 oz (70.1 kg)      Other studies Reviewed: None  Available   ASSESSMENT AND PLAN:  1. Hypertension: She remains on amlodipine 10 mg daily. Good control. Check BMET   2. Soft systolic heart murmur: Heard best at the apex no radiation to the axillary. Will check echocardiogram.    3. Unknown Cholesterol Status: Check fasting lipids and LFTS,   4. Rare positional dizziness: Check TSH   Current medicines are reviewed at length with the patient today.  I have also discussed this will Dr. Skeet Latch who is DOD at [facility] office.  She is in agreement with my assessment and plan.   Labs/ tests ordered today include:  Echocardiogram, TSH, Fasting lipids and LFTs  Phill Myron. West Pugh, ANP, AACC   08/24/2017 3:30 PM    [facility] [address] [place], [place] [address] Phone: [phone]; Fax: [phone]

## 2017-08-24 ENCOUNTER — Ambulatory Visit: Payer: 59 | Admitting: Adult Health

## 2017-08-24 ENCOUNTER — Encounter: Payer: Self-pay | Admitting: Adult Health

## 2017-08-24 VITALS — BP 132/92 | HR 74 | Ht 63.0 in | Wt 161.2 lb

## 2017-08-24 DIAGNOSIS — R011 Cardiac murmur, unspecified: Secondary | ICD-10-CM | POA: Diagnosis not present

## 2017-08-24 DIAGNOSIS — Z79899 Other long term (current) drug therapy: Secondary | ICD-10-CM | POA: Diagnosis not present

## 2017-08-24 DIAGNOSIS — I10 Essential (primary) hypertension: Secondary | ICD-10-CM | POA: Diagnosis not present

## 2017-08-24 NOTE — Patient Instructions (Signed)
Medication Instructions:  NO CHANGES- Your physician recommends that you continue on your current medications as directed. Please refer to the Current Medication list given to you today.  If you need a refill on your cardiac medications before your next appointment, please call your pharmacy.  Labwork: FLP,TSH AND LFT TODAY HERE IN OUR OFFICE AT [facility]  Take the provided lab slips for you to take with you to the lab for you blood draw.   Testing/Procedures: Echocardiogram - Your physician has requested that you have an echocardiogram. Echocardiography is a painless test that uses sound waves to create images of your heart. It provides your doctor with information about the size and shape of your heart and how well your heart's chambers and valves are working. This procedure takes approximately one hour. There are no restrictions for this procedure. This will be performed at our [facility] location - [address].  Follow-Up: Your physician wants you to follow-up in: [place] You should receive a reminder letter in the mail two months in advance. If you do not receive a letter, please call our office 12-2017 to schedule the 02-2018 follow-up appointment.   Thank you for choosing [facility] at [facility]!!

## 2017-08-28 DIAGNOSIS — Z79899 Other long term (current) drug therapy: Secondary | ICD-10-CM | POA: Diagnosis not present

## 2017-08-28 LAB — BASIC METABOLIC PANEL
BUN / CREAT RATIO: 19 (ref 9–23)
BUN: 15 mg/dL (ref 6–24)
CO2: 21 mmol/L (ref 20–29)
Calcium: 9.4 mg/dL (ref 8.7–10.2)
Chloride: 105 mmol/L (ref 96–106)
Creatinine, Ser: 0.81 mg/dL (ref 0.57–1.00)
GFR calc Af Amer: 101 mL/min/{1.73_m2} (ref >59)
GFR calc non Af Amer: 87 mL/min/{1.73_m2} (ref >59)
GLUCOSE: 77 mg/dL (ref 65–99)
POTASSIUM: 4.3 mmol/L (ref 3.5–5.2)
SODIUM: 140 mmol/L (ref 134–144)

## 2017-08-28 LAB — HEPATIC FUNCTION PANEL
ALK PHOS: 30 IU/L — AB (ref 39–117)
ALT: 12 IU/L (ref 0–32)
AST: 14 IU/L (ref 0–40)
Albumin: 4.2 g/dL (ref 3.5–5.5)
Bilirubin Total: 0.4 mg/dL (ref 0.0–1.2)
Bilirubin, Direct: 0.1 mg/dL (ref 0.00–0.40)
Total Protein: 6.8 g/dL (ref 6.0–8.5)

## 2017-08-28 LAB — LIPID PANEL
Chol/HDL Ratio: 3.1 ratio (ref 0.0–4.4)
Cholesterol, Total: 198 mg/dL (ref 100–199)
HDL: 63 mg/dL (ref >39)
LDL CALC: 120 mg/dL — AB (ref 0–99)
Triglycerides: 76 mg/dL (ref 0–149)
VLDL CHOLESTEROL CAL: 15 mg/dL (ref 5–40)

## 2017-08-28 LAB — TSH: TSH: 1.93 u[IU]/mL (ref 0.450–4.500)

## 2017-09-02 ENCOUNTER — Telehealth: Payer: Self-pay | Admitting: Adult Health

## 2017-09-02 NOTE — Telephone Encounter (Signed)
New Message   Patient is returning call in reference to lab results. Please call to discuss.  

## 2017-09-02 NOTE — Telephone Encounter (Signed)
Patient made aware of the results and verbalized her understanding. Per the result note, her results have been mailed.  Notes recorded by Lendon Colonel, NP on 08/31/2017 at 7:29 AM EDT Cholesterol study reveals elevated LDL. Please send her a low cholesterol diet. She may need to be placed on statin later if this does not improve.

## 2017-09-07 ENCOUNTER — Other Ambulatory Visit: Payer: Self-pay

## 2017-09-07 ENCOUNTER — Ambulatory Visit (HOSPITAL_COMMUNITY): Payer: 59 | Attending: Internal Medicine

## 2017-09-07 DIAGNOSIS — R42 Dizziness and giddiness: Secondary | ICD-10-CM | POA: Insufficient documentation

## 2017-09-07 DIAGNOSIS — R011 Cardiac murmur, unspecified: Secondary | ICD-10-CM | POA: Diagnosis not present

## 2017-09-07 DIAGNOSIS — I10 Essential (primary) hypertension: Secondary | ICD-10-CM | POA: Diagnosis not present

## 2017-09-10 ENCOUNTER — Ambulatory Visit: Payer: 59 | Admitting: Family Medicine

## 2017-09-10 ENCOUNTER — Other Ambulatory Visit: Payer: Self-pay

## 2017-09-10 ENCOUNTER — Encounter: Payer: Self-pay | Admitting: Family Medicine

## 2017-09-10 VITALS — BP 134/88 | HR 94 | Temp 98.4°F | Resp 17 | Ht 63.0 in | Wt 152.6 lb

## 2017-09-10 DIAGNOSIS — R011 Cardiac murmur, unspecified: Secondary | ICD-10-CM | POA: Diagnosis not present

## 2017-09-10 DIAGNOSIS — I10 Essential (primary) hypertension: Secondary | ICD-10-CM | POA: Diagnosis not present

## 2017-09-10 NOTE — Progress Notes (Signed)
Chief Complaint  Patient presents with  . Hypertension    re evaluation    HPI  Hypertension: Patient is here for evaluation of elevated blood pressures.  Age at onset of elevated blood pressure:  44.Cardiac symptoms none. Patient denies chest pain, chest pressure/discomfort, exertional chest pressure/discomfort, fatigue, lower extremity edema and palpitations.  Cardiovascular risk factors: hypertension. Use of agents associated with hypertension: none. History of target organ damage: none. BP Readings from Last 3 Encounters:  09/10/17 134/88  08/24/17 (!) 132/92  08/06/17 (!) 132/98   Heart Murmur Heart murmur was detected in February 2019 and referral placed for Cardiology Full work up was completed Pt does not have symptoms lightheadedness, dizziness, fatigue She denies palpitations   Past Medical History:  Diagnosis Date  . Heart murmur   . HTN (hypertension)     Current Outpatient Medications  Medication Sig Dispense Refill  . amLODipine (NORVASC) 10 MG tablet Take 1 tablet (10 mg total) by mouth daily. Office visit needed 90 tablet 3  . BLISOVI 24 FE 1-20 MG-MCG(24) tablet      No current facility-administered medications for this visit.     Allergies:  Allergies  Allergen Reactions  . Codeine Nausea And Vomiting    Past Surgical History:  Procedure Laterality Date  . Tubes in ears Bilateral     Social History   Socioeconomic History  . Marital status: Single    Spouse name: Not on file  . Number of children: 1  . Years of education: Not on file  . Highest education level: Some college, no degree  Occupational History  . Occupation: inside Scientist, clinical (histocompatibility and immunogenetics): [employer]  . Financial resource strain: Not on file  . Food insecurity:    Worry: Not on file    Inability: Not on file  . Transportation needs:    Medical: Not on file    Non-medical: Not on file  Tobacco Use  . Smoking status: Never Smoker  . Smokeless  tobacco: Never Used  Substance and Sexual Activity  . Alcohol use: Yes    Alcohol/week: 1.2 oz    Types: 2 Cans of beer per week    Comment: rare weekends  . Drug use: No  . Sexual activity: Not on file  Lifestyle  . Physical activity:    Days per week: Not on file    Minutes per session: Not on file  . Stress: Not on file  Relationships  . Social connections:    Talks on phone: Not on file    Gets together: Not on file    Attends religious service: Not on file    Active member of club or organization: Not on file    Attends meetings of clubs or organizations: Not on file    Relationship status: Not on file  Other Topics Concern  . Not on file  Social History Narrative   Lives with her daughter.   Mother and sister live in [place].    Family History  Problem Relation Age of Onset  . Hyperlipidemia Mother   . Hypertension Mother   . Cancer Father   . Hypertension Father   . Stroke Father   . Hypertension Sister      ROS Review of Systems See HPI Constitution: No fevers or chills No malaise No diaphoresis Skin: No rash or itching Eyes: no blurry vision, no double vision GU: no dysuria or hematuria Neuro: no dizziness or headaches  all others reviewed and negative   Objective: Vitals:   09/10/17 0821  BP: 134/88  Pulse: 94  Resp: 17  Temp: 98.4 F (36.9 C)  TempSrc: Oral  SpO2: 98%  Weight: 152 lb 9.6 oz (69.2 kg)  Height: 5\' 3"  (1.6 m)    Physical Exam Physical Exam  Constitutional: She is oriented to person, place, and time. She appears well-developed and well-nourished.  HENT:  Head: Normocephalic and atraumatic.  Eyes: Conjunctivae and EOM are normal.  Cardiovascular: Normal rate, regular rhythm and normal heart sounds.   Pulmonary/Chest: Effort normal and breath sounds normal. No respiratory distress. She has no wheezes.  Abdominal: Normal appearance and bowel sounds are normal. There is no tenderness. There is no CVA tenderness.    Neurological: She is alert and oriented to person, place, and time.    Date of study Echo 09/07/17 LV EF: 60% -   65%  ------------------------------------------------------------------- Indications:      Murmur (R01.1).  ------------------------------------------------------------------- History:   PMH:  Dizziness.  Risk factors:  Hypertension.  ------------------------------------------------------------------- Study Conclusions  - Left ventricle: The cavity size was normal. Wall thickness was   normal. Systolic function was normal. The estimated ejection   fraction was in the range of 60% to 65%.   Assessment and Plan Cyanne was seen today for hypertension.  Diagnoses and all orders for this visit:  Essential hypertension- continue Amlodipine  DASH Diet and exercise advised  Cardiac murmur-  Reviewed Cardiology notes and Echo Benign murmur Continue amlodipine     Braydyn Schultes A Nimrod Wendt

## 2017-09-10 NOTE — Patient Instructions (Addendum)
   IF you received an x-ray today, you will receive an invoice from [facility]. Please contact  Radiology at [phone] with questions or concerns regarding your invoice.   IF you received labwork today, you will receive an invoice from [facility]. Please contact [facility] at [phone] with questions or concerns regarding your invoice.   Our billing staff will not be able to assist you with questions regarding bills from these companies.  You will be contacted with the lab results as soon as they are available. The fastest way to get your results is to activate your My Chart account. Instructions are located on the last page of this paperwork. If you have not heard from us regarding the results in 2 weeks, please contact this office.     How to Take Your Blood Pressure Blood pressure is a measurement of how strongly your blood is pressing against the walls of your arteries. Arteries are blood vessels that carry blood from your heart throughout your body. Your health care provider takes your blood pressure at each office visit. You can also take your own blood pressure at home with a blood pressure machine. You may need to take your own blood pressure:  To confirm a diagnosis of high blood pressure (hypertension).  To monitor your blood pressure over time.  To make sure your blood pressure medicine is working.  Supplies needed: To take your blood pressure, you will need a blood pressure machine. You can buy a blood pressure machine, or blood pressure monitor, at most drugstores or online. There are several types of home blood pressure monitors. When choosing one, consider the following:  Choose a monitor that has an arm cuff.  Choose a monitor that wraps snugly around your upper arm. You should be able to fit only one finger between your arm and the cuff.  Do not choose a monitor that measures your blood pressure from your wrist or finger.  Your health care  provider can suggest a reliable monitor that will meet your needs. How to prepare To get the most accurate reading, avoid the following for 30 minutes before you check your blood pressure:  Drinking caffeine.  Drinking alcohol.  Eating.  Smoking.  Exercising.  Five minutes before you check your blood pressure:  Empty your bladder.  Sit quietly without talking in a dining chair, rather than in a soft couch or armchair.  How to take your blood pressure To check your blood pressure, follow the instructions in the manual that came with your blood pressure monitor. If you have a digital blood pressure monitor, the instructions may be as follows: 1. Sit up straight. 2. Place your feet on the floor. Do not cross your ankles or legs. 3. Rest your left arm at the level of your heart on a table or desk or on the arm of a chair. 4. Pull up your shirt sleeve. 5. Wrap the blood pressure cuff around the upper part of your left arm, 1 inch (2.5 cm) above your elbow. It is best to wrap the cuff around bare skin. 6. Fit the cuff snugly around your arm. You should be able to place only one finger between the cuff and your arm. 7. Position the cord inside the groove of your elbow. 8. Press the power button. 9. Sit quietly while the cuff inflates and deflates. 10. Read the digital reading on the monitor screen and write it down (record it). 11. Wait 2-3 minutes, then repeat the steps, starting   at step 1.  What does my blood pressure reading mean? A blood pressure reading consists of a higher number over a lower number. Ideally, your blood pressure should be below 120/80. The first ("top") number is called the systolic pressure. It is a measure of the pressure in your arteries as your heart beats. The second ("bottom") number is called the diastolic pressure. It is a measure of the pressure in your arteries as the heart relaxes. Blood pressure is classified into four stages. The following are the  stages for adults who do not have a short-term serious illness or a chronic condition. Systolic pressure and diastolic pressure are measured in a unit called mm Hg. Normal  Systolic pressure: below 120.  Diastolic pressure: below 80. Elevated  Systolic pressure: 120-129.  Diastolic pressure: below 80. Hypertension stage 1  Systolic pressure: 130-139.  Diastolic pressure: 80-89. Hypertension stage 2  Systolic pressure: 140 or above.  Diastolic pressure: 90 or above. You can have prehypertension or hypertension even if only the systolic or only the diastolic number in your reading is higher than normal. Follow these instructions at home:  Check your blood pressure as often as recommended by your health care provider.  Take your monitor to the next appointment with your health care provider to make sure: ? That you are using it correctly. ? That it provides accurate readings.  Be sure you understand what your goal blood pressure numbers are.  Tell your health care provider if you are having any side effects from blood pressure medicine. Contact a health care provider if:  Your blood pressure is consistently high. Get help right away if:  Your systolic blood pressure is higher than 180.  Your diastolic blood pressure is higher than 110. This information is not intended to replace advice given to you by your health care provider. Make sure you discuss any questions you have with your health care provider. Document Released: 11/02/2015 Document Revised: 01/15/2016 Document Reviewed: 11/02/2015 Elsevier Interactive Patient Education  2018 [employer] Inc.  

## 2018-02-25 ENCOUNTER — Ambulatory Visit: Payer: 59 | Admitting: Cardiovascular Disease

## 2018-03-11 ENCOUNTER — Ambulatory Visit: Payer: Self-pay | Admitting: Family Medicine

## 2018-04-12 ENCOUNTER — Encounter (HOSPITAL_COMMUNITY): Payer: Self-pay | Admitting: Emergency Medicine

## 2018-04-12 ENCOUNTER — Emergency Department (HOSPITAL_COMMUNITY)
Admission: EM | Admit: 2018-04-12 | Discharge: 2018-04-12 | Disposition: A | Discharge status: Home / Self Care | Payer: 59 | Attending: Emergency Medicine | Admitting: Emergency Medicine

## 2018-04-12 ENCOUNTER — Other Ambulatory Visit: Payer: Self-pay

## 2018-04-12 DIAGNOSIS — I10 Essential (primary) hypertension: Secondary | ICD-10-CM | POA: Diagnosis not present

## 2018-04-12 DIAGNOSIS — T7840XA Allergy, unspecified, initial encounter: Secondary | ICD-10-CM

## 2018-04-12 DIAGNOSIS — R21 Rash and other nonspecific skin eruption: Secondary | ICD-10-CM | POA: Diagnosis present

## 2018-04-12 DIAGNOSIS — Z79899 Other long term (current) drug therapy: Secondary | ICD-10-CM | POA: Insufficient documentation

## 2018-04-12 MED ORDER — METHYLPREDNISOLONE SODIUM SUCC 125 MG IJ SOLR
125.0000 mg | Freq: Once | INTRAMUSCULAR | Status: AC
  Administered 2018-04-12: 125 mg via INTRAVENOUS
  Filled 2018-04-12: qty 2

## 2018-04-12 MED ORDER — PREDNISONE 20 MG PO TABS: ORAL_TABLET | ORAL | 0 refills | Status: DC

## 2018-04-12 MED ORDER — FAMOTIDINE IN NACL 20-0.9 MG/50ML-% IV SOLN
20.0000 mg | Freq: Once | INTRAVENOUS | Status: AC
  Administered 2018-04-12: 20 mg via INTRAVENOUS
  Filled 2018-04-12: qty 50

## 2018-04-12 MED ORDER — DIPHENHYDRAMINE HCL 50 MG/ML IJ SOLN
50.0000 mg | Freq: Once | INTRAMUSCULAR | Status: AC
  Administered 2018-04-12: 50 mg via INTRAVENOUS
  Filled 2018-04-12: qty 1

## 2018-04-12 MED ORDER — FAMOTIDINE 20 MG PO TABS: 20.0000 mg | ORAL_TABLET | Freq: Two times a day (BID) | ORAL | 0 refills | Status: DC

## 2018-04-12 NOTE — Discharge Instructions (Addendum)
Take Benadryl 25 mg every 4-6 hours for rash and itching.  Follow-up with your doctor this week for recheck return sooner if problems or getting worse

## 2018-04-12 NOTE — ED Triage Notes (Signed)
Patient c/o intermittent itching rash to back and arms worsening x3days. Reports bottom lip swelling x1 hour. Denies SOB, throat swelling and difficulty breathing. No known allergies.

## 2018-04-12 NOTE — ED Provider Notes (Signed)
[facility] DEPT Provider Note   CSN: [id number] Arrival date & time: 04/12/18  2003     History   Chief Complaint Chief Complaint  Patient presents with  . Oral Swelling  . Rash    HPI Ashley Turner is a 47 y.o. female.  Patient started with a rash to her back and chest and arms.  She also has some swelling to her lower lip.  The rash is pruritic.  The history is provided by the patient. No language interpreter was used.  Rash   This is a new problem. The current episode started 2 days ago. The problem has not changed since onset.The problem is associated with nothing. There has been no fever. Affected Location: Chest and back. The pain is at a severity of 0/10. The patient is experiencing no pain. The pain has been constant since onset. Pertinent negatives include no blisters.    Past Medical History:  Diagnosis Date  . Heart murmur   . HTN (hypertension)     Patient Active Problem List   Diagnosis Date Noted  . Cardiac murmur 08/06/2017  . Essential hypertension 09/04/2015    Past Surgical History:  Procedure Laterality Date  . Tubes in ears Bilateral      OB History   None      Home Medications    Prior to Admission medications   Medication Sig Start Date End Date Taking? Authorizing Provider  amLODipine (NORVASC) 10 MG tablet Take 1 tablet (10 mg total) by mouth daily. Office visit needed 08/06/17  Yes Jeffery, [place], PA  BLISOVI 24 FE 1-20 MG-MCG(24) tablet  06/17/17  Yes [provider]  CALCIUM PO Take 1 tablet by mouth daily.   Yes [provider]  Cholecalciferol (VITAMIN D PO) Take 1 tablet by mouth daily.   Yes [provider]  Multiple Vitamin (MULTIVITAMIN WITH MINERALS) TABS tablet Take 1 tablet by mouth daily.   Yes [provider]  famotidine (PEPCID) 20 MG tablet Take 1 tablet (20 mg total) by mouth 2 (two) times daily. 04/12/18   Milton Ferguson, MD  predniSONE (DELTASONE) 20  MG tablet 2 tabs po daily x 3 days 04/12/18   Milton Ferguson, MD    Family History Family History  Problem Relation Age of Onset  . Hyperlipidemia Mother   . Hypertension Mother   . Cancer Father   . Hypertension Father   . Stroke Father   . Hypertension Sister     Social History Social History   Tobacco Use  . Smoking status: Never Smoker  . Smokeless tobacco: Never Used  Substance Use Topics  . Alcohol use: Yes    Alcohol/week: 2.0 standard drinks    Types: 2 Cans of beer per week    Comment: rare weekends  . Drug use: No     Allergies   Codeine   Review of Systems Review of Systems  Constitutional: Negative for appetite change and fatigue.  HENT: Negative for congestion, ear discharge and sinus pressure.   Eyes: Negative for discharge.  Respiratory: Negative for cough.   Cardiovascular: Negative for chest pain.  Gastrointestinal: Negative for abdominal pain and diarrhea.  Genitourinary: Negative for frequency and hematuria.  Musculoskeletal: Negative for back pain.  Skin: Positive for rash.       Pruritic rash  Neurological: Negative for seizures and headaches.  Psychiatric/Behavioral: Negative for hallucinations.     Physical Exam Updated Vital Signs BP (!) 126/97   Pulse 87  Temp 98 F (36.7 C) (Oral)   Resp 16   Ht 5\' 3"  (1.6 m)   Wt 72.6 kg   SpO2 100%   BMI 28.34 kg/m   Physical Exam  Constitutional: She is oriented to person, place, and time. She appears well-developed.  HENT:  Head: Normocephalic.  Minor swelling to lower lip wrist full pharynx normal  Eyes: Conjunctivae and EOM are normal. No scleral icterus.  Neck: Neck supple. No thyromegaly present.  Cardiovascular: Normal rate and regular rhythm. Exam reveals no gallop and no friction rub.  No murmur heard. Pulmonary/Chest: No stridor. She has no wheezes. She has no rales. She exhibits no tenderness.  Abdominal: She exhibits no distension. There is no tenderness. There is no  rebound.  Musculoskeletal: Normal range of motion. She exhibits no edema.  Lymphadenopathy:    She has no cervical adenopathy.  Neurological: She is oriented to person, place, and time. She exhibits normal muscle tone. Coordination normal.  Skin: Rash noted. No erythema.  Patient has hives to her back and shoulders.  Psychiatric: She has a normal mood and affect. Her behavior is normal.     ED Treatments / Results  Labs (all labs ordered are listed, but only abnormal results are displayed) Labs Reviewed - No data to display  EKG None  Radiology No results found.  Procedures Procedures (including critical care time)  Medications Ordered in ED Medications  methylPREDNISolone sodium succinate (SOLU-MEDROL) 125 mg/2 mL injection 125 mg (125 mg Intravenous Given 04/12/18 2057)  famotidine (PEPCID) IVPB 20 mg premix (0 mg Intravenous Stopped 04/12/18 2151)  diphenhydrAMINE (BENADRYL) injection 50 mg (50 mg Intravenous Given 04/12/18 2058)     Initial Impression / Assessment and Plan / ED Course  I have reviewed the triage vital signs and the nursing notes.  Pertinent labs & imaging results that were available during my care of the patient were reviewed by me and considered in my medical decision making (see chart for details).     Patient's rash improved with treatment in the emergency department.  Her lip swelling improved minor.  Patient being treated for allergic reaction and will be sent home with prednisone Pepcid and Benadryl.  She will follow-up with her PCP  Final Clinical Impressions(s) / ED Diagnoses   Final diagnoses:  Allergic reaction, initial encounter    ED Discharge Orders         Ordered    predniSONE (DELTASONE) 20 MG tablet     04/12/18 2248    famotidine (PEPCID) 20 MG tablet  2 times daily     04/12/18 2248           Milton Ferguson, MD 04/12/18 2253

## 2018-04-14 ENCOUNTER — Telehealth: Payer: Self-pay | Admitting: Family Medicine

## 2018-04-14 NOTE — Telephone Encounter (Signed)
Called and spoke with pt to let her know we scheduled her with Dr. Nolon Rod tomorrow 11/7 at 10:00 am. Advised pt of time, building number and late policy. Pt acknowledged.

## 2018-04-15 ENCOUNTER — Ambulatory Visit: Payer: 59 | Admitting: Family Medicine

## 2018-04-15 ENCOUNTER — Encounter: Payer: Self-pay | Admitting: Family Medicine

## 2018-04-15 ENCOUNTER — Other Ambulatory Visit: Payer: Self-pay

## 2018-04-15 VITALS — BP 149/92 | HR 95 | Temp 98.2°F | Resp 16 | Ht 63.0 in | Wt 167.4 lb

## 2018-04-15 DIAGNOSIS — L509 Urticaria, unspecified: Secondary | ICD-10-CM | POA: Insufficient documentation

## 2018-04-15 MED ORDER — EPINEPHRINE 0.3 MG/0.3ML IJ SOAJ: 0.3000 mg | Freq: Once | INTRAMUSCULAR | 0 refills | Status: AC

## 2018-04-15 MED ORDER — CETIRIZINE HCL 10 MG PO TABS: 10.0000 mg | ORAL_TABLET | Freq: Every day | ORAL | 11 refills | Status: DC

## 2018-04-15 NOTE — Patient Instructions (Signed)
° ° ° °  If you have lab work done today you will be contacted with your lab results within the next 2 weeks.  If you have not heard from us then please contact us. The fastest way to get your results is to register for My Chart. ° ° °IF you received an x-ray today, you will receive an invoice from [facility]. Please contact [facility] at [phone] with questions or concerns regarding your invoice.  ° °IF you received labwork today, you will receive an invoice from [facility]. Please contact [facility] at [phone] with questions or concerns regarding your invoice.  ° °Our billing staff will not be able to assist you with questions regarding bills from these companies. ° °You will be contacted with the lab results as soon as they are available. The fastest way to get your results is to activate your My Chart account. Instructions are located on the last page of this paperwork. If you have not heard from us regarding the results in 2 weeks, please contact this office. °  ° ° ° °

## 2018-04-15 NOTE — Progress Notes (Signed)
Chief Complaint  Patient presents with  . allergic reaction unsure to what    onset: Sat morning woke up with hives, gone on sunday, back on monday with red, itchy palms, and swollen lip, seen at [facility] monday night.  No new soaps, deodorants, lotions, perfumes or foods per pt.  Given prednisone and pepcid while at hospital, finished prednisone this morning.  Also told to take benadryl    HPI  Pt reports sudden onset of hives and itching starting Saturday morning 04/10/2018. She reports that they resolved in 24 hours but would wake up with symptoms. She states that she was having itching of palms of both hands a few weeks prior that would resolve in 1 hour She reports that on 04/12/18 she had lip swelling with the hives.  Since going to the ER on 04/12/18 and was given Benadryl, prednisone and pepcid She reports that she had resolution of symptoms She gets itching and hives and seems to start at 4am She reports that with prednisone and benadryl it resolves  She has a cat in her home She had not changed her diet, no new soaps, deodorants, lotions, perfumes, jewelry She works in Nurse, mental health She does not smoke    Past Medical History:  Diagnosis Date  . Heart murmur   . HTN (hypertension)     Current Outpatient Medications  Medication Sig Dispense Refill  . amLODipine (NORVASC) 10 MG tablet Take 1 tablet (10 mg total) by mouth daily. Office visit needed 90 tablet 3  . BLISOVI 24 FE 1-20 MG-MCG(24) tablet     . CALCIUM PO Take 1 tablet by mouth daily.    . Cholecalciferol (VITAMIN D PO) Take 1 tablet by mouth daily.    . famotidine (PEPCID) 20 MG tablet Take 1 tablet (20 mg total) by mouth 2 (two) times daily. 10 tablet 0  . Multiple Vitamin (MULTIVITAMIN WITH MINERALS) TABS tablet Take 1 tablet by mouth daily.    . predniSONE (DELTASONE) 20 MG tablet 2 tabs po daily x 3 days 6 tablet 0  . EPINEPHrine (EPIPEN 2-PAK) 0.3 mg/0.3 mL IJ SOAJ injection Inject 0.3 mLs (0.3 mg  total) into the muscle once for 1 dose. 0.3 mL 0   No current facility-administered medications for this visit.     Allergies:  Allergies  Allergen Reactions  . Codeine Nausea And Vomiting    Past Surgical History:  Procedure Laterality Date  . Tubes in ears Bilateral     Social History   Socioeconomic History  . Marital status: Single    Spouse name: Not on file  . Number of children: 1  . Years of education: Not on file  . Highest education level: Some college, no degree  Occupational History  . Occupation: inside Scientist, clinical (histocompatibility and immunogenetics): [employer]  . Financial resource strain: Not on file  . Food insecurity:    Worry: Not on file    Inability: Not on file  . Transportation needs:    Medical: Not on file    Non-medical: Not on file  Tobacco Use  . Smoking status: Never Smoker  . Smokeless tobacco: Never Used  Substance and Sexual Activity  . Alcohol use: Yes    Alcohol/week: 2.0 standard drinks    Types: 2 Cans of beer per week    Comment: rare weekends  . Drug use: No  . Sexual activity: Not on file  Lifestyle  . Physical activity:  Days per week: Not on file    Minutes per session: Not on file  . Stress: Not on file  Relationships  . Social connections:    Talks on phone: Not on file    Gets together: Not on file    Attends religious service: Not on file    Active member of club or organization: Not on file    Attends meetings of clubs or organizations: Not on file    Relationship status: Not on file  Other Topics Concern  . Not on file  Social History Narrative   Lives with her daughter.   Mother and sister live in [place].    Family History  Problem Relation Age of Onset  . Hyperlipidemia Mother   . Hypertension Mother   . Cancer Father   . Hypertension Father   . Stroke Father   . Hypertension Sister      ROS Review of Systems See HPI Constitution: No fevers or chills No malaise No diaphoresis Skin: No  rash or itching Eyes: no blurry vision, no double vision GU: no dysuria or hematuria Neuro: no dizziness or headaches  all others reviewed and negative   Objective: Vitals:   04/15/18 1034  BP: (!) 149/92  Pulse: 95  Resp: 16  Temp: 98.2 F (36.8 C)  TempSrc: Oral  SpO2: 97%  Weight: 167 lb 6.4 oz (75.9 kg)  Height: 5\' 3"  (1.6 m)    Physical Exam General: alert, oriented, in NAD Head: normocephalic, atraumatic, no sinus tenderness Eyes: EOM intact, no scleral icterus or conjunctival injection Ears: TM clear bilaterally Nose: mucosa nonerythematous, nonedematous Throat: no pharyngeal exudate or erythema Lymph: no posterior auricular, submental or cervical lymph adenopathy Heart: normal rate, normal sinus rhythm, no murmurs Lungs: clear to auscultation bilaterally, no wheezing   Assessment and Plan Paquita was seen today for allergic reaction unsure to what.  Diagnoses and all orders for this visit:  Hives- will refer to Allergy Discussed epi pen for emergency such as anaphylaxis Discussed continuing zyrtec in the daytime and benadryl at night -     Ambulatory referral to Allergy -     Allergy Panel, [facility] -     EPINEPHrine (EPIPEN 2-PAK) 0.3 mg/0.3 mL IJ SOAJ injection; Inject 0.3 mLs (0.3 mg total) into the muscle once for 1 dose.     [place]

## 2018-04-16 ENCOUNTER — Ambulatory Visit: Payer: Self-pay | Admitting: Family Medicine

## 2018-04-18 LAB — ALLERGY PANEL, ANIMAL GROUP
Chicken Feathers IgE: <0.1 kU/L
Cow Dander IgE: <0.1 kU/L
Mouse Urine IgE: <0.1 kU/L

## 2018-04-20 ENCOUNTER — Ambulatory Visit: Payer: Self-pay | Admitting: Physician Assistant

## 2018-05-08 ENCOUNTER — Other Ambulatory Visit: Payer: Self-pay

## 2018-05-08 ENCOUNTER — Emergency Department (HOSPITAL_COMMUNITY)
Admission: EM | Admit: 2018-05-08 | Discharge: 2018-05-09 | Disposition: A | Discharge status: Home / Self Care | Payer: 59 | Attending: Emergency Medicine | Admitting: Emergency Medicine

## 2018-05-08 ENCOUNTER — Encounter (HOSPITAL_COMMUNITY): Payer: Self-pay

## 2018-05-08 DIAGNOSIS — T7840XA Allergy, unspecified, initial encounter: Secondary | ICD-10-CM | POA: Diagnosis not present

## 2018-05-08 DIAGNOSIS — I10 Essential (primary) hypertension: Secondary | ICD-10-CM | POA: Insufficient documentation

## 2018-05-08 DIAGNOSIS — L509 Urticaria, unspecified: Secondary | ICD-10-CM | POA: Diagnosis not present

## 2018-05-08 MED ORDER — METHYLPREDNISOLONE SODIUM SUCC 125 MG IJ SOLR
125.0000 mg | Freq: Once | INTRAMUSCULAR | Status: AC
  Administered 2018-05-08: 125 mg via INTRAVENOUS
  Filled 2018-05-08: qty 2

## 2018-05-08 MED ORDER — FAMOTIDINE IN NACL 20-0.9 MG/50ML-% IV SOLN
20.0000 mg | Freq: Once | INTRAVENOUS | Status: AC
  Administered 2018-05-08: 20 mg via INTRAVENOUS
  Filled 2018-05-08: qty 50

## 2018-05-08 MED ORDER — DIPHENHYDRAMINE HCL 50 MG/ML IJ SOLN
50.0000 mg | Freq: Once | INTRAMUSCULAR | Status: AC
  Administered 2018-05-08: 50 mg via INTRAVENOUS
  Filled 2018-05-08: qty 1

## 2018-05-08 NOTE — ED Provider Notes (Signed)
TIME SEEN: 11:16 PM  CHIEF COMPLAINT: Hives, lip swelling  HPI: Patient is a 47 year old female with history of hypertension who presents to the emergency department with hives, lip swelling that started today.  States she has had these intermittent symptoms for 6 months.  Was seen here several weeks ago and discharged on prednisone and states symptoms completely resolved but then have come back.  Worsened tonight.  No known new exposures.  Has plan to see an allergist in 2 days.  Feels like she is having some lip swelling but no tongue swelling or difficulty breathing.  No wheezing.  No dizziness.  ROS: See HPI Constitutional: no fever  Eyes: no drainage  ENT: no runny nose   Cardiovascular:  no chest pain  Resp: no SOB  GI: no vomiting GU: no dysuria Integumentary: no rash  Allergy:  hives  Musculoskeletal: no leg swelling  Neurological: no slurred speech ROS otherwise negative  PAST MEDICAL HISTORY/PAST SURGICAL HISTORY:  Past Medical History:  Diagnosis Date  . Heart murmur   . HTN (hypertension)     MEDICATIONS:  Prior to Admission medications   Medication Sig Start Date End Date Taking? Authorizing Provider  amLODipine (NORVASC) 10 MG tablet Take 1 tablet (10 mg total) by mouth daily. Office visit needed 08/06/17   Harrison Mons, PA  BLISOVI 24 FE 1-20 MG-MCG(24) tablet  06/17/17   [provider]  CALCIUM PO Take 1 tablet by mouth daily.    [provider]  cetirizine (ZYRTEC) 10 MG tablet Take 1 tablet (10 mg total) by mouth daily. 04/15/18   Forrest Moron, MD  Cholecalciferol (VITAMIN D PO) Take 1 tablet by mouth daily.    [provider]  famotidine (PEPCID) 20 MG tablet Take 1 tablet (20 mg total) by mouth 2 (two) times daily. 04/12/18   Milton Ferguson, MD  Multiple Vitamin (MULTIVITAMIN WITH MINERALS) TABS tablet Take 1 tablet by mouth daily.    [provider]  predniSONE (DELTASONE) 20 MG tablet 2 tabs po daily x 3 days 04/12/18    Milton Ferguson, MD    ALLERGIES:  Allergies  Allergen Reactions  . Codeine Nausea And Vomiting    SOCIAL HISTORY:  Social History   Tobacco Use  . Smoking status: Never Smoker  . Smokeless tobacco: Never Used  Substance Use Topics  . Alcohol use: Yes    Alcohol/week: 2.0 standard drinks    Types: 2 Cans of beer per week    Comment: rare weekends    FAMILY HISTORY: Family History  Problem Relation Age of Onset  . Hyperlipidemia Mother   . Hypertension Mother   . Cancer Father   . Hypertension Father   . Stroke Father   . Hypertension Sister     EXAM: BP 135/88 (BP Location: Left Arm)   Pulse (!) 118   Temp 98.3 F (36.8 C) (Oral)   Resp 20   SpO2 100%  CONSTITUTIONAL: Alert and oriented and responds appropriately to questions. Well-appearing; well-nourished HEAD: Normocephalic EYES: Conjunctivae clear, pupils appear equal, EOMI ENT: normal nose; moist mucous membranes, no angioedema, normal speech, no trismus or drooling, no stridor, airway patent, no tonsillar hypertrophy or exudate, no uvular deviation NECK: Supple, no meningismus, no nuchal rigidity, no LAD  CARD: Regular and tachycardic; S1 and S2 appreciated; no murmurs, no clicks, no rubs, no gallops RESP: Normal chest excursion without splinting or tachypnea; breath sounds clear and equal bilaterally; no wheezes, no rhonchi, no rales, no hypoxia or  respiratory distress, speaking full sentences ABD/GI: Normal bowel sounds; non-distended; soft, non-tender, no rebound, no guarding, no peritoneal signs, no hepatosplenomegaly BACK:  The back appears normal and is non-tender to palpation, there is no CVA tenderness EXT: Normal ROM in all joints; non-tender to palpation; no edema; normal capillary refill; no cyanosis, no calf tenderness or swelling    SKIN: Normal color for age and race; warm; scattered urticaria to her trunk and extremities, no blisters or desquamation, no petechiae or purpura NEURO: Moves all  extremities equally PSYCH: The patient's mood and manner are appropriate. Grooming and personal hygiene are appropriate.  MEDICAL DECISION MAKING: Patient here with allergic reaction.  Unknown trigger.  Given she feels that there is some airway involvement will give IV steroids, antihistamines.  At this time I do not feel she needs epinephrine but will monitor closely.  Patient comfortable with this plan.  ED PROGRESS: Patient reports feeling much better.  Hives have improved.  Heart rate has also improved.  Will discharge home with prednisone taper and antihistamines.  Discussed return precautions.  Patient has outpatient follow-up.  She verbalized understanding.   At this time, I do not feel there is any life-threatening condition present. I have reviewed and discussed all results (EKG, imaging, lab, urine as appropriate) and exam findings with patient/family. I have reviewed nursing notes and appropriate previous records.  I feel the patient is safe to be discharged home without further emergent workup and can continue workup as an outpatient as needed. Discussed usual and customary return precautions. Patient/family verbalize understanding and are comfortable with this plan.  Outpatient follow-up has been provided as needed. All questions have been answered.      , Delice Bison, DO 05/09/18 [phone]

## 2018-05-08 NOTE — ED Triage Notes (Addendum)
Pt was seen weeks ago for an allergic reaction with an unknown trigger her symptoms resolved for a few days, but have returned over the last week or so. She is scheduled to go to the allergist on Monday and they have asked her to refrain from antihistamine use. She presents tonight with full body hives and lip swelling. She denies SOB or wheezing. A&Ox4. Ambulatory.

## 2018-05-09 MED ORDER — PREDNISONE 10 MG (21) PO TBPK: ORAL_TABLET | ORAL | 0 refills | Status: DC

## 2018-05-09 MED ORDER — FAMOTIDINE 20 MG PO TABS: 20.0000 mg | ORAL_TABLET | Freq: Two times a day (BID) | ORAL | 0 refills | Status: DC

## 2018-05-09 NOTE — Discharge Instructions (Signed)
You may take Benadryl 50 mg every 8 hours.

## 2018-05-10 ENCOUNTER — Encounter: Payer: Self-pay | Admitting: Allergy

## 2018-05-10 ENCOUNTER — Ambulatory Visit: Payer: 59 | Admitting: Allergy

## 2018-05-10 VITALS — BP 124/80 | HR 100 | Temp 98.3°F | Resp 18 | Ht 63.5 in | Wt 173.0 lb

## 2018-05-10 DIAGNOSIS — L509 Urticaria, unspecified: Secondary | ICD-10-CM | POA: Diagnosis not present

## 2018-05-10 DIAGNOSIS — T783XXD Angioneurotic edema, subsequent encounter: Secondary | ICD-10-CM

## 2018-05-10 DIAGNOSIS — T783XXA Angioneurotic edema, initial encounter: Secondary | ICD-10-CM | POA: Insufficient documentation

## 2018-05-10 NOTE — Assessment & Plan Note (Addendum)
5 week history of urticaria, pruritus and episodes of angioedema. No specific triggers noted. Question if Norvasc may be contributing to her symptoms as when dose was increased 6 months ago she noticed milder symptoms of pruritus and erythema. Patient was on zyrtec 10mg  QD and Pepcid 20mg  BID with good benefit. Had a flare when stopped zyrtec requiring ER visit.  Based on clinical history, question if Norvasc or cat may be a trigger. She did get a new cat in June. Unable to skin test today due to recent antihistamine intake.  Get bloodwork as below. If unremarkable then will stop Norvasc and will need alternative medication.   Restart zyrtec 10mg  twice a day.  Continue Pepcid 20mg  twice a day.   Finish prednisone taper.  For mild symptoms you can take over the counter antihistamines such as Benadryl and monitor symptoms closely. If symptoms worsen or if you have severe symptoms including breathing issues, throat closure, significant swelling, whole body hives, severe diarrhea and vomiting, lightheadedness then inject epinephrine and seek immediate medical care afterwards. Avoid the following potential triggers: alcohol, tight clothing, NSAIDs.

## 2018-05-10 NOTE — Progress Notes (Signed)
New Patient Note  RE: Ashley Turner MRN: [id number] DOB: 1970/12/06 Date of Office Visit: 05/10/2018  Referring provider: Forrest Moron, MD Primary care provider: Forrest Moron, MD  Chief Complaint: Urticaria; Angioedema; and Allergic Reaction (only change has been amlodipine increased from 5 mg to 10 mg. she was having heat & redness with the 5 mg. hives started with the 10 mg )  History of Present Illness: I had the pleasure of seeing Ashley Turner for initial evaluation at the [facility] [place] [facility] [place] on 05/10/2018. She is a 47 y.o. female, who is referred here by Forrest Moron, MD for the evaluation of allergic reaction.   Rash/swelling started about 5 weeks ago. She woke up with hives on her back initially. The hives can occur on her whole body. Describes them as erythematous, pruritus and raised. Individual rashes lasts about 24 hours. Some areas had ecchymosis upon resolution. Associated symptoms include: perioral angioedema and facial edema. Suspected triggers are unknown. Denies any fevers, chills, foods, personal care products or recent infections. Patient was on Norvasc 5mg  for about 1.5 years and about 6 months ago they increased to 10mg . She has noticed some erythema on her legs since then about once a week with some associated hand pruritus at times but didn't think much of it as they usually resolved within a few hours. She usually takes Norvasc in the morning and the hives can come on anytime during the day and sometimes wakes up with them.   She has tried the following therapies: zyrtec 10mg  daily, Pepcid 20mg  BID with some benefit. She has been off antihistamines since Thursday and had to go to the ER on 05/08/2018 for a flare which involved facial swelling. She had 2 courses of systemic steroids and currently on prednisone taper.  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  Previous history of rash/hives:  no. Patient is up to date with the following cancer screening tests: up to date with pap smears and mammogram.  Received solumedrol, benadryl and Pepcid in the ER on 05/08/2018.  05/08/2018 ER visit: "Patient is a 47 year old female with history of hypertension who presents to the emergency department with hives, lip swelling that started today.  States she has had these intermittent symptoms for 6 months.  Was seen here several weeks ago and discharged on prednisone and states symptoms completely resolved but then have come back.  Worsened tonight.  No known new exposures.  Has plan to see an allergist in 2 days.  Feels like she is having some lip swelling but no tongue swelling or difficulty breathing.  No wheezing.  No dizziness."  Assessment and Plan: Ashley Turner is a 47 y.o. female with: Urticaria 5 week history of urticaria, pruritus and episodes of angioedema. No specific triggers noted. Question if Norvasc may be contributing to her symptoms as when dose was increased 6 months ago she noticed milder symptoms of pruritus and erythema. Patient was on zyrtec 10mg  QD and Pepcid 20mg  BID with good benefit. Had a flare when stopped zyrtec requiring ER visit.  Based on clinical history, question if Norvasc or cat may be a trigger. She did get a new cat in June. Unable to skin test today due to recent antihistamine intake.  Get bloodwork as below. If unremarkable then will stop Norvasc and will need alternative medication.   Restart zyrtec 10mg  twice a day.  Continue Pepcid 20mg  twice a  day.   Finish prednisone taper.  For mild symptoms you can take over the counter antihistamines such as Benadryl and monitor symptoms closely. If symptoms worsen or if you have severe symptoms including breathing issues, throat closure, significant swelling, whole body hives, severe diarrhea and vomiting, lightheadedness then inject epinephrine and seek immediate medical care afterwards. Avoid the following potential  triggers: alcohol, tight clothing, NSAIDs.   Angio-edema See assessment and plan as above.  Return in about 4 weeks (around 06/07/2018).  Lab Orders     Thyroid Cascade Profile     CBC with Differential/Platelet     ANA w/Reflex     Alpha-Gal Panel     Comprehensive metabolic panel     C4 complement  Other allergy screening: Asthma: no Rhino conjunctivitis: no Food allergy: no Medication allergy: yes  Codeine - nausea and vomiting Hymenoptera allergy: no Urticaria: yes Eczema:no History of recurrent infections suggestive of immunodeficency: no  Diagnostics: Skin Testing: Deferred due to recent antihistamines use.  Past Medical History: Patient Active Problem List   Diagnosis Date Noted  . 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 Urticaria 04/15/2018  . Cardiac murmur 08/06/2017  . Essential hypertension 09/04/2015   Past Medical History:  Diagnosis Date  . Angio-edema   . Heart murmur   . HTN (hypertension)   . Urticaria    Past Surgical History: Past Surgical History:  Procedure Laterality Date  . Tubes in ears Bilateral   . TYMPANOSTOMY TUBE PLACEMENT     Medication List:  Current Outpatient Medications  Medication Sig Dispense Refill  . amLODipine (NORVASC) 10 MG tablet Take 1 tablet (10 mg total) by mouth daily. Office visit needed 90 tablet 3  . BLISOVI 24 FE 1-20 MG-MCG(24) tablet Take 1 tablet by mouth daily.     Marland Kitchen CALCIUM PO Take 1 tablet by mouth daily.    . cetirizine (ZYRTEC) 10 MG tablet Take 1 tablet (10 mg total) by mouth daily. (Patient taking differently: Take 10 mg by mouth daily as needed for allergies. ) 30 tablet 11  . Cholecalciferol (VITAMIN D PO) Take 1 tablet by mouth daily.    Marland Kitchen EPINEPHrine 0.3 mg/0.3 mL IJ SOAJ injection Inject 0.3 mg into the muscle once.     . famotidine (PEPCID) 20 MG tablet Take 1 tablet (20 mg total) by mouth 2 (two) times daily. 10 tablet 0  . Multiple Vitamin (MULTIVITAMIN WITH MINERALS) TABS tablet Take 1 tablet by mouth  daily.    . predniSONE (STERAPRED UNI-PAK 21 TAB) 10 MG (21) TBPK tablet Take as directed 21 tablet 0   No current facility-administered medications for this visit.    Allergies: Allergies  Allergen Reactions  . Codeine Nausea And Vomiting   Social History: Social History   Socioeconomic History  . Marital status: Single    Spouse name: Not on file  . Number of children: 1  . Years of education: Not on file  . Highest education level: Some college, no degree  Occupational History  . Occupation: inside Scientist, clinical (histocompatibility and immunogenetics): [employer]  . Financial resource strain: Not on file  . Food insecurity:    Worry: Not on file    Inability: Not on file  . Transportation needs:    Medical: Not on file    Non-medical: Not on file  Tobacco Use  . Smoking status: Never Smoker  . Smokeless tobacco: Never Used  Substance and Sexual Activity  . Alcohol use: Yes  Alcohol/week: 2.0 standard drinks    Types: 2 Cans of beer per week    Comment: rare weekends  . Drug use: No  . Sexual activity: Not on file  Lifestyle  . Physical activity:    Days per week: Not on file    Minutes per session: Not on file  . Stress: Not on file  Relationships  . Social connections:    Talks on phone: Not on file    Gets together: Not on file    Attends religious service: Not on file    Active member of club or organization: Not on file    Attends meetings of clubs or organizations: Not on file    Relationship status: Not on file  Other Topics Concern  . Not on file  Social History Narrative   Lives with her daughter.   Mother and sister live in [place].   Lives in a [address] year old home. Smoking: denies Occupation: Theatre stage manager support for 12 years  Environmental History: Water Damage/mildew in the house: yes Carpet in the family room: no Carpet in the bedroom: no Heating: gas Cooling: central Pet: yes 1 cat x June 2019 but she had cats before but last cat was  about 9 years ago.  Family History: Family History  Problem Relation Age of Onset  . Hyperlipidemia Mother   . Hypertension Mother   . Cancer Father   . Hypertension Father   . Stroke Father   . Hypertension Sister    Problem                               Relation Asthma                                   No Eczema                                No Food allergy                          No Allergic rhino conjunctivitis     No Hives/angioedema  No  Review of Systems  Constitutional: Negative for appetite change, chills, fever and unexpected weight change.  HENT: Negative for congestion and rhinorrhea.   Eyes: Negative for itching.  Respiratory: Negative for cough, chest tightness, shortness of breath and wheezing.   Cardiovascular: Negative for chest pain.  Gastrointestinal: Negative for abdominal pain.  Genitourinary: Negative for difficulty urinating.  Skin: Positive for rash.  Allergic/Immunologic: Negative for environmental allergies and food allergies.  Neurological: Negative for headaches.   Objective: BP 124/80 (BP Location: Left Arm, Patient Position: Sitting, Cuff Size: Normal)   Pulse 100   Temp 98.3 F (36.8 C) (Oral)   Resp 18   Ht 5' 3.5" (1.613 m)   Wt 173 lb (78.5 kg)   SpO2 98%   BMI 30.16 kg/m  Body mass index is 30.16 kg/m. Physical Exam  Constitutional: She is oriented to person, place, and time. She appears well-developed and well-nourished.  HENT:  Head: Normocephalic and atraumatic.  Right Ear: External ear normal.  Left Ear: External ear normal.  Nose: Nose normal.  Mouth/Throat: Oropharynx is clear and moist.  Eyes: Conjunctivae and EOM are normal.  Neck: Neck supple.  Cardiovascular: Normal  rate, regular rhythm and normal heart sounds. Exam reveals no gallop and no friction rub.  No murmur heard. Pulmonary/Chest: Effort normal and breath sounds normal. She has no wheezes. She has no rales.  Abdominal: Soft. Bowel sounds are normal. There  is no tenderness.  Lymphadenopathy:    She has no cervical adenopathy.  Neurological: She is alert and oriented to person, place, and time.  Skin: Skin is warm. Rash noted.  Diffuse urticarial rashes on lower abdominal area, upper thigh and neck.   Psychiatric: She has a normal mood and affect. Her behavior is normal.  Nursing note and vitals reviewed.  The plan was reviewed with the patient/family, and all questions/concerned were addressed.  It was my pleasure to see Ashley Turner today and participate in her care. Please feel free to contact me with any questions or concerns.  Sincerely,  Rexene Alberts, DO Allergy & Immunology  [facility] office: [phone] [place]

## 2018-05-10 NOTE — Patient Instructions (Signed)
Get bloodwork  Restart zyrtec 10mg  twice a day Continue pepcid 20mg  twice a day Finish prednisone taper.  For mild symptoms you can take over the counter antihistamines such as Benadryl and monitor symptoms closely. If symptoms worsen or if you have severe symptoms including breathing issues, throat closure, significant swelling, whole body hives, severe diarrhea and vomiting, lightheadedness then inject epinephrine and seek immediate medical care afterwards.  Follow up in 4 weeks.

## 2018-05-10 NOTE — Assessment & Plan Note (Signed)
.   See assessment and plan as above. 

## 2018-05-14 ENCOUNTER — Encounter: Payer: Self-pay | Admitting: Allergy

## 2018-05-14 LAB — CBC WITH DIFFERENTIAL/PLATELET
BASOS ABS: 0 10*3/uL (ref 0.0–0.2)
Basos: 0 %
EOS (ABSOLUTE): 0 10*3/uL (ref 0.0–0.4)
Eos: 0 %
HEMOGLOBIN: 13.7 g/dL (ref 11.1–15.9)
Hematocrit: 41.2 % (ref 34.0–46.6)
Immature Grans (Abs): 0.1 10*3/uL (ref 0.0–0.1)
Immature Granulocytes: 1 %
LYMPHS: 5 %
Lymphocytes Absolute: 0.8 10*3/uL (ref 0.7–3.1)
MCH: 28.2 pg (ref 26.6–33.0)
MCHC: 33.3 g/dL (ref 31.5–35.7)
MCV: 85 fL (ref 79–97)
Monocytes Absolute: 0.5 10*3/uL (ref 0.1–0.9)
Monocytes: 3 %
NEUTROS ABS: 15.7 10*3/uL — AB (ref 1.4–7.0)
NEUTROS PCT: 91 %
Platelets: 349 10*3/uL (ref 150–450)
RBC: 4.85 x10E6/uL (ref 3.77–5.28)
RDW: 12.1 % — ABNORMAL LOW (ref 12.3–15.4)
WBC: 17.1 10*3/uL — ABNORMAL HIGH (ref 3.4–10.8)

## 2018-05-14 LAB — COMPREHENSIVE METABOLIC PANEL
A/G RATIO: 1.7 (ref 1.2–2.2)
ALBUMIN: 4 g/dL (ref 3.5–5.5)
ALT: 13 IU/L (ref 0–32)
AST: 17 IU/L (ref 0–40)
Alkaline Phosphatase: 36 IU/L — ABNORMAL LOW (ref 39–117)
BUN / CREAT RATIO: 18 (ref 9–23)
BUN: 13 mg/dL (ref 6–24)
CALCIUM: 8.7 mg/dL (ref 8.7–10.2)
CHLORIDE: 106 mmol/L (ref 96–106)
CO2: 19 mmol/L — ABNORMAL LOW (ref 20–29)
Creatinine, Ser: 0.72 mg/dL (ref 0.57–1.00)
GFR, EST AFRICAN AMERICAN: 115 mL/min/{1.73_m2} (ref >59)
GFR, EST NON AFRICAN AMERICAN: 100 mL/min/{1.73_m2} (ref >59)
GLOBULIN, TOTAL: 2.3 g/dL (ref 1.5–4.5)
Glucose: 103 mg/dL — ABNORMAL HIGH (ref 65–99)
POTASSIUM: 3.8 mmol/L (ref 3.5–5.2)
Sodium: 141 mmol/L (ref 134–144)
TOTAL PROTEIN: 6.3 g/dL (ref 6.0–8.5)

## 2018-05-14 LAB — ALPHA-GAL PANEL
BEEF CLASS INTERPRETATION: 0
Class Interpretation: 0
LAMB CLASS INTERPRETATION: 0
Lamb/Mutton (Ovis spp) IgE: <0.1 kU/L (ref <0.35)
Pork (Sus spp) IgE: <0.1 kU/L (ref <0.35)

## 2018-05-14 LAB — C4 COMPLEMENT: Complement C4, Serum: 24 mg/dL (ref 14–44)

## 2018-05-14 LAB — ANA W/REFLEX: ANA: NEGATIVE

## 2018-05-14 LAB — THYROID CASCADE PROFILE: TSH: 2.01 u[IU]/mL (ref 0.450–4.500)

## 2018-06-07 ENCOUNTER — Ambulatory Visit: Payer: 59 | Admitting: Allergy

## 2018-06-07 ENCOUNTER — Encounter: Payer: Self-pay | Admitting: Allergy

## 2018-06-07 VITALS — BP 142/84 | HR 84 | Resp 16 | Ht 63.0 in | Wt 173.0 lb

## 2018-06-07 DIAGNOSIS — T783XXD Angioneurotic edema, subsequent encounter: Secondary | ICD-10-CM

## 2018-06-07 DIAGNOSIS — L509 Urticaria, unspecified: Secondary | ICD-10-CM | POA: Diagnosis not present

## 2018-06-07 MED ORDER — FAMOTIDINE 20 MG PO TABS: 20.0000 mg | ORAL_TABLET | Freq: Two times a day (BID) | ORAL | 5 refills | Status: DC

## 2018-06-07 NOTE — Patient Instructions (Addendum)
Urticaria  Increase zyrtec to 20mg  twice a day as long as it doesn't make you too tired.  If hives still occurring then   Start Pepcid 20mg  twice a day.   If hives still occurring then call the office so we can start singulair.   Avoid the following potential triggers: alcohol, tight clothing, NSAIDs.   Follow up in 3 months

## 2018-06-07 NOTE — Progress Notes (Signed)
Follow Up Note  RE: Ashley Turner MRN: [id number] DOB: September 10, 1970 Date of Office Visit: 06/07/2018  Referring provider: Forrest Moron, MD Primary care provider: Forrest Moron, MD  Chief Complaint: Urticaria  History of Present Illness: I had the pleasure of seeing Ashley Turner for a follow up visit at the [facility] [place] [facility] [place] on 06/07/2018. She is a 47 y.o. female, who is being followed for urticaria, angioedema. Today she is here for regular follow up visit. Her previous allergy office visit was on 05/10/2018 with Dr. Maudie Mercury.   Urticaria The hives/angioedema completely resolved but starting on 05/27/2018 woke up with hives on the back and on the arms. The hives have not been as bad as before but noticing them more in the mornings. No more angioedema episodes.  Currently on zyrtec 10mg  BID and not on Pepcid anymore as she ran out.   Assessment and Plan: Ashley Turner is a 47 y.o. female with: Urticaria Past history: 5 week history of urticaria, pruritus and episodes of angioedema. No specific triggers noted. Question if Norvasc may be contributing to her symptoms as when dose was increased 6 months ago she noticed milder symptoms of pruritus and erythema. Patient was on zyrtec 10mg  QD and Pepcid 20mg  BID with good benefit. Had a flare when stopped zyrtec requiring ER visit. Interim history: much improved but still having some hives in the morning. Bloodwork all unremarkable (Thyroid Cascade Profile, CBC with Differential/Platelet, ANA w/Reflex, Alpha-Gal Panel, Comprehensive metabolic panel, C4 complement)  Increase zyrtec to 20mg  twice a day as long as it doesn't make you too tired.  If hives still occurring then start Pepcid 20mg  twice a day.   If hives still occurring then call the office so we can start singulair.   Avoid the following potential triggers: alcohol, tight clothing, NSAIDs.   If above regimen does not control symptoms then may need to stop Norvasc and also get  environmental allergy testing to cats.   Angio-edema No more angioedema episodes.  Return in about 3 months (around 09/06/2018).  Meds ordered this encounter  Medications  . famotidine (PEPCID) 20 MG tablet    Sig: Take 1 tablet (20 mg total) by mouth 2 (two) times daily.    Dispense:  60 tablet    Refill:  5   Diagnostics: None.  Medication List:  Current Outpatient Medications  Medication Sig Dispense Refill  . amLODipine (NORVASC) 10 MG tablet Take 1 tablet (10 mg total) by mouth daily. Office visit needed 90 tablet 3  . BLISOVI 24 FE 1-20 MG-MCG(24) tablet Take 1 tablet by mouth daily.     Marland Kitchen CALCIUM PO Take 1 tablet by mouth daily.    . cetirizine (ZYRTEC) 10 MG tablet Take 1 tablet (10 mg total) by mouth daily. (Patient taking differently: Take 10 mg by mouth daily as needed for allergies. ) 30 tablet 11  . Cholecalciferol (VITAMIN D PO) Take 1 tablet by mouth daily.    Marland Kitchen EPINEPHrine 0.3 mg/0.3 mL IJ SOAJ injection Inject 0.3 mg into the muscle once.     . Multiple Vitamin (MULTIVITAMIN WITH MINERALS) TABS tablet Take 1 tablet by mouth daily.    . famotidine (PEPCID) 20 MG tablet Take 1 tablet (20 mg total) by mouth 2 (two) times daily. 60 tablet 5  . predniSONE (STERAPRED UNI-PAK 21 TAB) 10 MG (21) TBPK tablet Take as directed (Patient not taking: Reported on 06/07/2018) 21 tablet 0   No current facility-administered medications for  this visit.    Allergies: Allergies  Allergen Reactions  . Codeine Nausea And Vomiting   I reviewed her past medical history, social history, family history, and environmental history and no significant changes have been reported from previous visit on 05/10/2018.  Review of Systems  Constitutional: Negative for appetite change, chills, fever and unexpected weight change.  HENT: Negative for congestion and rhinorrhea.   Eyes: Negative for itching.  Respiratory: Negative for cough, chest tightness, shortness of breath and wheezing.     Cardiovascular: Negative for chest pain.  Gastrointestinal: Negative for abdominal pain.  Genitourinary: Negative for difficulty urinating.  Skin: Positive for rash.  Allergic/Immunologic: Negative for environmental allergies and food allergies.  Neurological: Negative for headaches.   Objective: BP (!) 142/84 (BP Location: Left Arm)   Pulse 84   Resp 16   Ht 5\' 3"  (1.6 m)   Wt 173 lb (78.5 kg)   SpO2 97%   BMI 30.65 kg/m  Body mass index is 30.65 kg/m. Physical Exam  Constitutional: She is oriented to person, place, and time. She appears well-developed and well-nourished.  HENT:  Head: Normocephalic and atraumatic.  Right Ear: External ear normal.  Left Ear: External ear normal.  Nose: Nose normal.  Mouth/Throat: Oropharynx is clear and moist.  Eyes: Conjunctivae and EOM are normal.  Neck: Neck supple.  Cardiovascular: Normal rate, regular rhythm and normal heart sounds. Exam reveals no gallop and no friction rub.  No murmur heard. Pulmonary/Chest: Effort normal and breath sounds normal. She has no wheezes. She has no rales.  Abdominal: Soft.  Neurological: She is alert and oriented to person, place, and time.  Skin: Skin is warm. Rash noted.  Few urticarial rash on right anterior shoulder area.  Psychiatric: She has a normal mood and affect. Her behavior is normal.  Nursing note and vitals reviewed.  Previous notes and tests were reviewed. The plan was reviewed with the patient/family, and all questions/concerned were addressed.  It was my pleasure to see Ashley Turner today and participate in her care. Please feel free to contact me with any questions or concerns.  Sincerely,  Rexene Alberts, DO Allergy & Immunology  [facility] office: [phone] [facility] office: [phone]

## 2018-06-07 NOTE — Assessment & Plan Note (Signed)
Past history: 5 week history of urticaria, pruritus and episodes of angioedema. No specific triggers noted. Question if Norvasc may be contributing to her symptoms as when dose was increased 6 months ago she noticed milder symptoms of pruritus and erythema. Patient was on zyrtec 10mg  QD and Pepcid 20mg  BID with good benefit. Had a flare when stopped zyrtec requiring ER visit. Interim history: much improved but still having some hives in the morning. Bloodwork all unremarkable (Thyroid Cascade Profile, CBC with Differential/Platelet, ANA w/Reflex, Alpha-Gal Panel, Comprehensive metabolic panel, C4 complement)  Increase zyrtec to 20mg  twice a day as long as it doesn't make you too tired.  If hives still occurring then start Pepcid 20mg  twice a day.   If hives still occurring then call the office so we can start singulair.   Avoid the following potential triggers: alcohol, tight clothing, NSAIDs.   If above regimen does not control symptoms then may need to stop Norvasc and also get environmental allergy testing to cats.

## 2018-06-12 ENCOUNTER — Encounter: Payer: Self-pay | Admitting: Family Medicine

## 2018-06-15 ENCOUNTER — Telehealth: Payer: Self-pay

## 2018-06-15 MED ORDER — MONTELUKAST SODIUM 10 MG PO TABS: 10.0000 mg | ORAL_TABLET | Freq: Every day | ORAL | 2 refills | Status: DC

## 2018-06-15 NOTE — Telephone Encounter (Addendum)
FYI: Pt called stating that she is continuing having hives. Pt Increased zyrtec to 20mg  twice a day and started Pepcid20mg  twice a day per instructions from 06/07/18 OV. Per OV note if hives still are occurring we can start singulair. rx sent to pharmacy on file. Pt notified and advised to contact office if symptoms worsen or persist.

## 2018-06-20 NOTE — Telephone Encounter (Signed)
Please set patient up with a same day slot on my schedule or with Dr. Pamella Pert   Dr. Nolon Rod

## 2018-06-21 NOTE — Telephone Encounter (Signed)
Called and spoke with pt regarding an appt with Dr. Nolon Rod. Per Dr. Nolon Rod, I was able to set up a same-day for 06/29/18 at 8:20 AM. I advised pt of time, building and late policy. I did offer tomorrow 06/22/18 but pt stated that she had to go out of town and it was a 2 hour drive so could not come in unless it was first thing in the morning. The first available was 10:40 so pt declined appt.

## 2018-06-29 ENCOUNTER — Encounter: Payer: Self-pay | Admitting: Family Medicine

## 2018-06-29 ENCOUNTER — Ambulatory Visit: Payer: 59 | Admitting: Family Medicine

## 2018-06-29 ENCOUNTER — Other Ambulatory Visit: Payer: Self-pay

## 2018-06-29 VITALS — BP 140/70 | HR 83 | Temp 98.5°F | Resp 17 | Ht 63.0 in | Wt 177.2 lb

## 2018-06-29 DIAGNOSIS — Z5181 Encounter for therapeutic drug level monitoring: Secondary | ICD-10-CM

## 2018-06-29 DIAGNOSIS — I10 Essential (primary) hypertension: Secondary | ICD-10-CM

## 2018-06-29 DIAGNOSIS — L509 Urticaria, unspecified: Secondary | ICD-10-CM

## 2018-06-29 MED ORDER — OLMESARTAN MEDOXOMIL 20 MG PO TABS: 20.0000 mg | ORAL_TABLET | Freq: Every day | ORAL | 1 refills | Status: DC

## 2018-06-29 MED ORDER — OLMESARTAN MEDOXOMIL 20 MG PO TABS: 10.0000 mg | ORAL_TABLET | Freq: Every day | ORAL | 1 refills | Status: DC

## 2018-06-29 NOTE — Progress Notes (Signed)
Established Patient Office Visit  Subjective:  Patient ID: Ashley Turner, female    DOB: [birth year]  Age: 48 y.o. MRN: [id number]  CC:  Chief Complaint  Patient presents with  . Urticaria  . ? side effect to amlodipine when dosage went from 5 mg to 10    HPI Ashley Turner presents for   Pt was started on zyrtec and pepcid by [facility]ergy in December 2019 for hives She states that she was told that if her hives worsened to get singulair She is now taking zyrtec, pepcid and singulair She does not have hives today  Hypertension: Patient here for follow-up of elevated blood pressure. She is exercising and is adherent to low salt diet.  Blood pressure is well controlled at home. Cardiac symptoms none. Patient denies chest pain, claudication, dyspnea, exertional chest pressure/discomfort and irregular heart beat.  Cardiovascular risk factors: hypertension. Use of agents associated with hypertension: none. History of target organ damage: none. BP Readings from Last 3 Encounters:  06/29/18 140/70  06/07/18 (!) 142/84  05/10/18 124/80     Past Medical History:  Diagnosis Date  . Angio-edema   . Heart murmur   . HTN (hypertension)   . Urticaria     Past Surgical History:  Procedure Laterality Date  . Tubes in ears Bilateral   . TYMPANOSTOMY TUBE PLACEMENT      Family History  Problem Relation Age of Onset  . Hyperlipidemia Mother   . Hypertension Mother   . Cancer Father   . Hypertension Father   . Stroke Father   . Hypertension Sister     Social History   Socioeconomic History  . Marital status: Single    Spouse name: Not on file  . Number of children: 1  . Years of education: Not on file  . Highest education level: Some college, no degree  Occupational History  . Occupation: inside Scientist, clinical (histocompatibility and immunogenetics): [employer]  . Financial resource strain: Not on file  . Food insecurity:    Worry: Not on file    Inability: Not on file  .  Transportation needs:    Medical: Not on file    Non-medical: Not on file  Tobacco Use  . Smoking status: Never Smoker  . Smokeless tobacco: Never Used  Substance and Sexual Activity  . Alcohol use: Yes    Alcohol/week: 2.0 standard drinks    Types: 2 Cans of beer per week    Comment: rare weekends  . Drug use: No  . Sexual activity: Not on file  Lifestyle  . Physical activity:    Days per week: Not on file    Minutes per session: Not on file  . Stress: Not on file  Relationships  . Social connections:    Talks on phone: Not on file    Gets together: Not on file    Attends religious service: Not on file    Active member of club or organization: Not on file    Attends meetings of clubs or organizations: Not on file    Relationship status: Not on file  . Intimate partner violence:    Fear of current or ex partner: Not on file    Emotionally abused: Not on file    Physically abused: Not on file    Forced sexual activity: Not on file  Other Topics Concern  . Not on file  Social History Narrative   Lives with her  daughter.   Mother and sister live in [place].    Outpatient Medications Prior to Visit  Medication Sig Dispense Refill  . BLISOVI 24 FE 1-20 MG-MCG(24) tablet Take 1 tablet by mouth daily.     Marland Kitchen CALCIUM PO Take 1 tablet by mouth daily.    . cetirizine (ZYRTEC) 10 MG tablet Take 1 tablet (10 mg total) by mouth daily. (Patient taking differently: Take 10 mg by mouth daily as needed for allergies. ) 30 tablet 11  . Cholecalciferol (VITAMIN D PO) Take 1 tablet by mouth daily.    Marland Kitchen EPINEPHrine 0.3 mg/0.3 mL IJ SOAJ injection Inject 0.3 mg into the muscle once.     . famotidine (PEPCID) 20 MG tablet Take 1 tablet (20 mg total) by mouth 2 (two) times daily. 60 tablet 5  . montelukast (SINGULAIR) 10 MG tablet Take 1 tablet (10 mg total) by mouth daily. 30 tablet 2  . Multiple Vitamin (MULTIVITAMIN WITH MINERALS) TABS tablet Take 1 tablet by mouth daily.    Marland Kitchen amLODipine  (NORVASC) 10 MG tablet Take 1 tablet (10 mg total) by mouth daily. Office visit needed 90 tablet 3  . predniSONE (STERAPRED UNI-PAK 21 TAB) 10 MG (21) TBPK tablet Take as directed (Patient not taking: Reported on 06/07/2018) 21 tablet 0   No facility-administered medications prior to visit.     Allergies  Allergen Reactions  . Codeine Nausea And Vomiting    ROS Review of Systems    Objective:    Physical Exam  BP 140/70 (BP Location: Left Arm, Patient Position: Sitting, Cuff Size: Normal)   Pulse 83   Temp 98.5 F (36.9 C) (Oral)   Resp 17   Ht 5\' 3"  (1.6 m)   Wt 177 lb 3.2 oz (80.4 kg)   SpO2 99%   BMI 31.39 kg/m  Wt Readings from Last 3 Encounters:  06/29/18 177 lb 3.2 oz (80.4 kg)  06/07/18 173 lb (78.5 kg)  05/10/18 173 lb (78.5 kg)   Physical Exam  Constitutional: Oriented to person, place, and time. Appears well-developed and well-nourished.  HENT:  Head: Normocephalic and atraumatic.  Eyes: Conjunctivae and EOM are normal.  Cardiovascular: Normal rate, regular rhythm, normal heart sounds and intact distal pulses.  No murmur heard. Pulmonary/Chest: Effort normal and breath sounds normal. No stridor. No respiratory distress. Has no wheezes.  Neurological: Is alert and oriented to person, place, and time.  Skin: Skin is warm. Capillary refill takes less than 2 seconds. No rash present.  Psychiatric: Has a normal mood and affect. Behavior is normal. Judgment and thought content normal.    Health Maintenance Due  Topic Date Due  . HIV Screening  04/23/1986    There are no preventive care reminders to display for this patient.  Lab Results  Component Value Date   TSH 2.010 05/10/2018   Lab Results  Component Value Date   WBC 17.1 (H) 05/10/2018   HGB 13.7 05/10/2018   HCT 41.2 05/10/2018   MCV 85 05/10/2018   PLT 349 05/10/2018   Lab Results  Component Value Date   NA 141 05/10/2018   K 3.8 05/10/2018   CO2 19 (L) 05/10/2018   GLUCOSE 103 (H)  05/10/2018   BUN 13 05/10/2018   CREATININE 0.72 05/10/2018   BILITOT <0.2 05/10/2018   ALKPHOS 36 (L) 05/10/2018   AST 17 05/10/2018   ALT 13 05/10/2018   PROT 6.3 05/10/2018   ALBUMIN 4.0 05/10/2018   CALCIUM 8.7 05/10/2018  Lab Results  Component Value Date   CHOL 198 08/28/2017   Lab Results  Component Value Date   HDL 63 08/28/2017   Lab Results  Component Value Date   LDLCALC 120 (H) 08/28/2017   Lab Results  Component Value Date   TRIG 76 08/28/2017   Lab Results  Component Value Date   CHOLHDL 3.1 08/28/2017   Lab Results  Component Value Date   HGBA1C 5.2 11/07/2016      Assessment & Plan:   Problem List Items Addressed This Visit      Cardiovascular and Mediastinum   Essential hypertension - Primary   Relevant Medications   olmesartan (BENICAR) 20 MG tablet     Musculoskeletal and Integument   Urticaria   Relevant Medications   olmesartan (BENICAR) 20 MG tablet    Other Visit Diagnoses    Encounter for medication monitoring          Meds ordered this encounter  Medications  . DISCONTD: olmesartan (BENICAR) 20 MG tablet    Sig: Take 0.5 tablets (10 mg total) by mouth daily.    Dispense:  30 tablet    Refill:  1  . olmesartan (BENICAR) 20 MG tablet    Sig: Take 1 tablet (20 mg total) by mouth daily.    Dispense:  30 tablet    Refill:  1    Please discard previous script for 0.5 tablet.    Follow-up: Return in about 3 months (around 09/28/2018) for blood pressure and potassium check .    Forrest Moron, MD

## 2018-06-29 NOTE — Patient Instructions (Addendum)
° ° ° °  If you have lab work done today you will be contacted with your lab results within the next 2 weeks.  If you have not heard from us then please contact us. The fastest way to get your results is to register for My Chart. ° ° °IF you received an x-ray today, you will receive an invoice from [facility]. Please contact  Radiology at [phone] with questions or concerns regarding your invoice.  ° °IF you received labwork today, you will receive an invoice from [facility]. Please contact [facility] at [phone] with questions or concerns regarding your invoice.  ° °Our billing staff will not be able to assist you with questions regarding bills from these companies. ° °You will be contacted with the lab results as soon as they are available. The fastest way to get your results is to activate your My Chart account. Instructions are located on the last page of this paperwork. If you have not heard from us regarding the results in 2 weeks, please contact this office. °  ° ° ° °

## 2018-07-12 DIAGNOSIS — Z1212 Encounter for screening for malignant neoplasm of rectum: Secondary | ICD-10-CM | POA: Diagnosis not present

## 2018-07-12 DIAGNOSIS — Z01419 Encounter for gynecological examination (general) (routine) without abnormal findings: Secondary | ICD-10-CM | POA: Diagnosis not present

## 2018-07-12 DIAGNOSIS — Z6830 Body mass index (BMI) 30.0-30.9, adult: Secondary | ICD-10-CM | POA: Diagnosis not present

## 2018-07-12 DIAGNOSIS — Z1231 Encounter for screening mammogram for malignant neoplasm of breast: Secondary | ICD-10-CM | POA: Diagnosis not present

## 2018-07-13 ENCOUNTER — Other Ambulatory Visit: Payer: Self-pay | Admitting: Obstetrics and Gynecology

## 2018-07-13 DIAGNOSIS — R928 Other abnormal and inconclusive findings on diagnostic imaging of breast: Secondary | ICD-10-CM

## 2018-07-16 ENCOUNTER — Other Ambulatory Visit: Payer: Self-pay | Admitting: Obstetrics and Gynecology

## 2018-07-16 ENCOUNTER — Ambulatory Visit
Admission: RE | Admit: 2018-07-16 | Discharge: 2018-07-16 | Disposition: A | Discharge status: Home / Self Care | Payer: 59 | Source: Ambulatory Visit | Attending: Obstetrics and Gynecology | Admitting: Obstetrics and Gynecology

## 2018-07-16 DIAGNOSIS — R922 Inconclusive mammogram: Secondary | ICD-10-CM | POA: Diagnosis not present

## 2018-07-16 DIAGNOSIS — R928 Other abnormal and inconclusive findings on diagnostic imaging of breast: Secondary | ICD-10-CM

## 2018-07-16 DIAGNOSIS — N6320 Unspecified lump in the left breast, unspecified quadrant: Secondary | ICD-10-CM

## 2018-07-16 DIAGNOSIS — N6489 Other specified disorders of breast: Secondary | ICD-10-CM | POA: Diagnosis not present

## 2018-07-23 ENCOUNTER — Ambulatory Visit
Admission: RE | Admit: 2018-07-23 | Discharge: 2018-07-23 | Disposition: A | Discharge status: Home / Self Care | Payer: 59 | Source: Ambulatory Visit | Attending: Obstetrics and Gynecology | Admitting: Obstetrics and Gynecology

## 2018-07-23 DIAGNOSIS — N6012 Diffuse cystic mastopathy of left breast: Secondary | ICD-10-CM | POA: Diagnosis not present

## 2018-07-23 DIAGNOSIS — N6321 Unspecified lump in the left breast, upper outer quadrant: Secondary | ICD-10-CM | POA: Diagnosis not present

## 2018-07-23 DIAGNOSIS — N6320 Unspecified lump in the left breast, unspecified quadrant: Secondary | ICD-10-CM

## 2018-08-22 ENCOUNTER — Other Ambulatory Visit: Payer: Self-pay | Admitting: Family Medicine

## 2018-08-22 DIAGNOSIS — I10 Essential (primary) hypertension: Secondary | ICD-10-CM

## 2018-08-22 DIAGNOSIS — L509 Urticaria, unspecified: Secondary | ICD-10-CM

## 2018-09-02 ENCOUNTER — Other Ambulatory Visit: Payer: Self-pay | Admitting: Allergy

## 2018-09-06 ENCOUNTER — Ambulatory Visit: Payer: Self-pay | Admitting: Allergy

## 2018-09-14 ENCOUNTER — Ambulatory Visit: Payer: 59 | Admitting: Family Medicine

## 2018-09-20 ENCOUNTER — Ambulatory Visit: Payer: Self-pay | Admitting: Allergy

## 2018-10-11 ENCOUNTER — Other Ambulatory Visit: Payer: Self-pay

## 2018-10-11 ENCOUNTER — Encounter: Payer: Self-pay | Admitting: Allergy

## 2018-10-11 ENCOUNTER — Ambulatory Visit (INDEPENDENT_AMBULATORY_CARE_PROVIDER_SITE_OTHER): Payer: 59 | Admitting: Allergy

## 2018-10-11 DIAGNOSIS — T783XXD Angioneurotic edema, subsequent encounter: Secondary | ICD-10-CM | POA: Diagnosis not present

## 2018-10-11 DIAGNOSIS — L509 Urticaria, unspecified: Secondary | ICD-10-CM | POA: Diagnosis not present

## 2018-10-11 MED ORDER — PREDNISONE 10 MG PO TABS: ORAL_TABLET | ORAL | 0 refills | Status: DC

## 2018-10-11 NOTE — Assessment & Plan Note (Signed)
Past history - 5 week history of urticaria, pruritus and episodes of angioedema. No specific triggers noted. Question if Norvasc may be contributing to her symptoms as when dose was increased 6 months ago she noticed milder symptoms of pruritus and erythema. Patient was on zyrtec 10mg  QD and Pepcid 20mg  BID with good benefit. Had a flare when stopped zyrtec requiring ER visit. Labwork unremarkable.  Interim history - Was doing well up until Easter and having another flare with daily hives.   Start prednisone taper.  Let me know how the hives are by the end of the week.   I think patient would benefit from Xolair injections.  Discussed the role of adding omalizumab  (anti-IgE antibodies) in controlling urticaria in refractory patients.  Avoid the following potential triggers: alcohol, tight clothing, NSAIDs.   Continue zyrtec to 20mg  twice a day as long as it doesn't make you too tired.  Continue Pepcid 20mg  twice a day.   Continue Singulair 10mg  daily.

## 2018-10-11 NOTE — Progress Notes (Signed)
RE: Ashley Turner MRN: [id number] DOB: August 06, 1970 Date of Telemedicine Visit: 10/11/2018  Referring provider: Forrest Moron, MD Primary care provider: Forrest Moron, MD  Chief Complaint: Urticaria   Telemedicine Follow Up Visit via WebEx: I connected with Ashley Turner for a follow up on 10/11/18 by WebEx and verified that I am speaking with the correct person using two identifiers.   I discussed the limitations, risks, security and privacy concerns of performing an evaluation and management service by telemedicine and the availability of in person appointments. I also discussed with the patient that there may be a patient responsible charge related to this service. The patient expressed understanding and agreed to proceed.  Patient is at home. Provider is at the office.  Visit start time: 2:55PM Visit end time: 3:15PM Insurance consent/check in by: Dorris Fetch. Medical consent and medical assistant/nurse: Filomena Jungling.  History of Present Illness: She is a 48 y.o. female, who is being followed for urticaria. Her previous allergy office visit was on 06/07/2018 with Dr. Maudie Mercury. Today is a new complaint visit of worsening hives.  Urticaria Patient was doing fairly well up until Easter weekend. She did get a few spots of hives but a few weeks ago started with whole body hives again. Still occurring on a daily basis.   She changed her BP medication (Norvasc) but symptoms did not improve.  Denies any changes in diet, personal care products or recent infections.  Currently on zyrtec 20mg  BID, Pepcid 20mg  BID, Singulair 10mg  daily. No recent prednisone since November 2019.   No more angioedema episodes.   Assessment and Plan: Ashley Turner is a 48 y.o. female with: Urticaria Past history - 5 week history of urticaria, pruritus and episodes of angioedema. No specific triggers noted. Question if Norvasc may be contributing to her symptoms as when dose was increased 6 months ago she noticed milder  symptoms of pruritus and erythema. Patient was on zyrtec 10mg  QD and Pepcid 20mg  BID with good benefit. Had a flare when stopped zyrtec requiring ER visit. Labwork unremarkable.  Interim history - Was doing well up until Easter and having another flare with daily hives.   Start prednisone taper.  Let me know how the hives are by the end of the week.   I think patient would benefit from Xolair injections.  Discussed the role of adding omalizumab  (anti-IgE antibodies) in controlling urticaria in refractory patients.  Avoid the following potential triggers: alcohol, tight clothing, NSAIDs.   Continue zyrtec to 20mg  twice a day as long as it doesn't make you too tired.  Continue Pepcid 20mg  twice a day.   Continue Singulair 10mg  daily.  Angio-edema  No more angioedema episodes.  Return in about 3 months (around 01/11/2019).  Meds ordered this encounter  Medications  . predniSONE (DELTASONE) 10 MG tablet    Sig: Take prednisone 40mg  daily x 2 days, 30mg  daily x 2 days, 20mg  daily x 2 days and 10mg  daily x 2 days.    Dispense:  20 tablet    Refill:  0   Diagnostics: None.  Medication List:  Current Outpatient Medications  Medication Sig Dispense Refill  . BLISOVI 24 FE 1-20 MG-MCG(24) tablet Take 1 tablet by mouth daily.     Marland Kitchen CALCIUM PO Take 1 tablet by mouth daily.    . cetirizine (ZYRTEC) 10 MG tablet Take 1 tablet (10 mg total) by mouth daily. (Patient taking differently: Take 10 mg by mouth daily as needed for allergies. )  30 tablet 11  . Cholecalciferol (VITAMIN D PO) Take 1 tablet by mouth daily.    Marland Kitchen EPINEPHrine 0.3 mg/0.3 mL IJ SOAJ injection Inject 0.3 mg into the muscle once.     . famotidine (PEPCID) 20 MG tablet Take 1 tablet (20 mg total) by mouth 2 (two) times daily. 60 tablet 5  . montelukast (SINGULAIR) 10 MG tablet TAKE 1 TABLET BY MOUTH EVERY DAY 30 tablet 2  . Multiple Vitamin (MULTIVITAMIN WITH MINERALS) TABS tablet Take 1 tablet by mouth daily.    Marland Kitchen  olmesartan (BENICAR) 20 MG tablet TAKE 1 TABLET BY MOUTH EVERY DAY 90 tablet 0  . predniSONE (DELTASONE) 10 MG tablet Take prednisone 40mg  daily x 2 days, 30mg  daily x 2 days, 20mg  daily x 2 days and 10mg  daily x 2 days. 20 tablet 0   No current facility-administered medications for this visit.    Allergies: Allergies  Allergen Reactions  . Codeine Nausea And Vomiting   I reviewed her past medical history, social history, family history, and environmental history and no significant changes have been reported from previous visit on 06/07/2018.  Review of Systems  Constitutional: Negative for appetite change, chills, fever and unexpected weight change.  HENT: Negative for congestion and rhinorrhea.   Eyes: Negative for itching.  Respiratory: Negative for cough, chest tightness, shortness of breath and wheezing.   Cardiovascular: Negative for chest pain.  Gastrointestinal: Negative for abdominal pain.  Genitourinary: Negative for difficulty urinating.  Skin: Positive for rash.  Allergic/Immunologic: Negative for environmental allergies and food allergies.  Neurological: Negative for headaches.   Objective: Physical Exam  Skin: Rash noted.  Few hives on torso and face   Not obtained as encounter was done via WebEx.  Previous notes and tests were reviewed.  I discussed the assessment and treatment plan with the patient. The patient was provided an opportunity to ask questions and all were answered. The patient agreed with the plan and demonstrated an understanding of the instructions. After visit summary/patient instructions available via mychart.   The patient was advised to call back or seek an in-person evaluation if the symptoms worsen or if the condition fails to improve as anticipated.  I provided 20 minutes of video-face-to-face time during this encounter.  It was my pleasure to participate in Ashley Turner's care today. Please feel free to contact me with any questions or  concerns.   Sincerely,  Rexene Alberts, DO Allergy & Immunology  [facility] office: [phone] [facility] office: [phone]

## 2018-10-11 NOTE — Assessment & Plan Note (Signed)
   No more angioedema episodes.

## 2018-10-11 NOTE — Patient Instructions (Signed)
   Start prednisone taper.  Let me know how the hives are by the end of the week.  Discussed the role of adding omalizumab  (anti-IgE antibodies) in controlling urticaria in refractory patients. Here's information about Xolair - [email or web address]://rodriguez.org/. Avoid the following potential triggers: alcohol, tight clothing, NSAIDs.   Continue zyrtec to 20mg  twice a day as long as it doesn't make you too tired.  Continue Pepcid 20mg  twice a day.   Continue Singulair 10mg  daily.  Follow up in 3 months

## 2018-11-12 ENCOUNTER — Other Ambulatory Visit: Payer: Self-pay | Admitting: Family Medicine

## 2018-11-12 DIAGNOSIS — I10 Essential (primary) hypertension: Secondary | ICD-10-CM

## 2018-11-12 DIAGNOSIS — L509 Urticaria, unspecified: Secondary | ICD-10-CM

## 2018-11-12 NOTE — Telephone Encounter (Signed)
Requested Prescriptions  Pending Prescriptions Disp Refills  . olmesartan (BENICAR) 20 MG tablet [Pharmacy Med Name: OLMESARTAN MEDOXOMIL 20 MG TAB] 30 tablet 2    Sig: TAKE 1 TABLET BY MOUTH EVERY DAY     Cardiovascular:  Angiotensin Receptor Blockers Failed - 11/12/2018  1:09 AM      Failed - Cr in normal range and within 180 days    Creatinine, Ser  Date Value Ref Range Status  05/10/2018 0.72 0.57 - 1.00 mg/dL Final         Failed - K in normal range and within 180 days    Potassium  Date Value Ref Range Status  05/10/2018 3.8 3.5 - 5.2 mmol/L Final         Failed - Last BP in normal range    BP Readings from Last 1 Encounters:  06/29/18 140/70         Passed - Patient is not pregnant      Passed - Valid encounter within last 6 months    Recent Outpatient Visits          4 months ago Essential hypertension   [facility] [place], [place]   7 months ago Hives   [facility] at [facility], Arlie Solomons, [place]   1 year ago Essential hypertension   [facility] at [facility], Arlie Solomons, MD   1 year ago Essential hypertension   [facility] at [place], [place], [place]   2 years ago Essential hypertension   [facility] at [facility], Arlie Solomons, [place]      Future Appointments            In 2 months Garnet Sierras, DO [facility] [place]

## 2019-01-12 ENCOUNTER — Ambulatory Visit: Payer: 59 | Admitting: Allergy

## 2019-01-27 ENCOUNTER — Telehealth: Payer: Self-pay | Admitting: *Deleted

## 2019-01-27 ENCOUNTER — Ambulatory Visit: Payer: 59 | Admitting: Allergy

## 2019-01-27 ENCOUNTER — Other Ambulatory Visit: Payer: Self-pay

## 2019-01-27 ENCOUNTER — Encounter: Payer: Self-pay | Admitting: Allergy

## 2019-01-27 VITALS — BP 160/102 | HR 84 | Temp 97.8°F | Resp 18

## 2019-01-27 DIAGNOSIS — L509 Urticaria, unspecified: Secondary | ICD-10-CM

## 2019-01-27 DIAGNOSIS — T783XXD Angioneurotic edema, subsequent encounter: Secondary | ICD-10-CM

## 2019-01-27 MED ORDER — EPINEPHRINE 0.3 MG/0.3ML IJ SOAJ: 0.3000 mg | INTRAMUSCULAR | 1 refills | Status: DC | PRN

## 2019-01-27 NOTE — Progress Notes (Signed)
Follow Up Note  RE: Ashley Turner MRN: [id number] DOB: 01-May-1971 Date of Office Visit: 01/27/2019  Referring provider: Forrest Moron, MD Primary care provider: Forrest Moron, MD  Chief Complaint: Urticaria  History of Present Illness: I had the pleasure of seeing Ashley Turner for a follow up visit at the [facility] on 01/27/2019. She is a 48 y.o. female, who is being followed for urticaria/angioedema. Today she is here for regular follow up visit. Her previous allergy office visit was on 10/11/2018 with Dr. Maudie Mercury via telemedicine.  Urticaria Patient had the hives cleared up completely after prednisone course in May up until last week of July. Since then she has been having daily hives which are worse in the mornings. Today is actually a good day for her.  Currently on zyrtec 20mg  BID, Pepcid 20mg  BID, Singulair 10mg  QHS with some benefit. Denies any changes in diet, medications, personal care products.  No more additional swelling episodes. Interested in starting [place].   Assessment and Plan: Zayne is a 48 y.o. female with: Urticaria Past history - 5 week history of urticaria, pruritus and episodes of angioedema. No specific triggers noted. Question if Norvasc may be contributing to her symptoms as when dose was increased 6 months ago she noticed milder symptoms of pruritus and erythema. Patient was on zyrtec 10mg  QD and Pepcid 20mg  BID with good benefit. Had a flare when stopped zyrtec requiring ER visit. Labwork unremarkable.  Interim history - Prednisone cleared up last flare but starting at the end of July having daily hives again despite taking antihistamines as below. Not as bas as before and no angioedema episodes. Off Norvasc now but BP not as controlled and hives still there.   Start Xolair 300mg  injections every 4 weeks.   Discussed risks and benefits of Xolair. Patient in agreement and would like to start.   Avoid the following potential triggers:  alcohol, tight clothing, NSAIDs.   Continue zyrtec to 20mg  twice a day.   Continue Pepcid20mg  twice a day.  Continue Singulair 10mg  daily.  I have prescribed epinephrine injectable and demonstrated proper use. For mild symptoms you can take over the counter antihistamines such as Benadryl and monitor symptoms closely. If symptoms worsen or if you have severe symptoms including breathing issues, throat closure, significant swelling, whole body hives, severe diarrhea and vomiting, lightheadedness then inject epinephrine and seek immediate medical care afterwards.  Angio-edema  No more angioedema episodes.  Return in about 2 months (around 03/29/2019).  Meds ordered this encounter  Medications  . EPINEPHrine (AUVI-Q) 0.3 mg/0.3 mL IJ SOAJ injection    Sig: Inject 0.3 mLs (0.3 mg total) into the muscle as needed for anaphylaxis.    Dispense:  2 each    Refill:  1    [phone] (M)   Diagnostics: None.   Medication List:  Current Outpatient Medications  Medication Sig Dispense Refill  . BLISOVI 24 FE 1-20 MG-MCG(24) tablet Take 1 tablet by mouth daily.     Marland Kitchen CALCIUM PO Take 1 tablet by mouth daily.    . cetirizine (ZYRTEC) 10 MG tablet Take 20 mg by mouth 2 (two) times daily.    . Cholecalciferol (VITAMIN D PO) Take 1 tablet by mouth daily.    . famotidine (PEPCID) 20 MG tablet Take 1 tablet (20 mg total) by mouth 2 (two) times daily. 60 tablet 5  . montelukast (SINGULAIR) 10 MG tablet TAKE 1 TABLET BY MOUTH EVERY DAY 30 tablet  2  . Multiple Vitamin (MULTIVITAMIN WITH MINERALS) TABS tablet Take 1 tablet by mouth daily.    Marland Kitchen olmesartan (BENICAR) 20 MG tablet Take 20 mg by mouth daily.    Marland Kitchen EPINEPHrine (AUVI-Q) 0.3 mg/0.3 mL IJ SOAJ injection Inject 0.3 mLs (0.3 mg total) into the muscle as needed for anaphylaxis. 2 each 1   No current facility-administered medications for this visit.    Allergies: Allergies  Allergen Reactions  . Codeine Nausea And Vomiting   I reviewed  her past medical history, social history, family history, and environmental history and no significant changes have been reported from previous visit on 10/11/2018.  Review of Systems  Constitutional: Negative for appetite change, chills, fever and unexpected weight change.  HENT: Negative for congestion and rhinorrhea.   Eyes: Negative for itching.  Respiratory: Negative for cough, chest tightness, shortness of breath and wheezing.   Cardiovascular: Negative for chest pain.  Gastrointestinal: Negative for abdominal pain.  Genitourinary: Negative for difficulty urinating.  Skin: Positive for rash.  Allergic/Immunologic: Negative for environmental allergies and food allergies.  Neurological: Negative for headaches.   Objective: BP (!) 160/102 (BP Location: Right Arm, Patient Position: Sitting, Cuff Size: Normal)   Pulse 84   Temp 97.8 F (36.6 C) (Temporal)   Resp 18   SpO2 98%  There is no height or weight on file to calculate BMI. Physical Exam  Constitutional: She is oriented to person, place, and time. She appears well-developed and well-nourished.  HENT:  Head: Normocephalic and atraumatic.  Right Ear: External ear normal.  Left Ear: External ear normal.  Nose: Nose normal.  Mouth/Throat: Oropharynx is clear and moist.  Eyes: Conjunctivae and EOM are normal.  Neck: Neck supple.  Cardiovascular: Normal rate, regular rhythm and normal heart sounds. Exam reveals no gallop and no friction rub.  No murmur heard. Pulmonary/Chest: Effort normal and breath sounds normal. She has no wheezes. She has no rales.  Abdominal: Soft.  Neurological: She is alert and oriented to person, place, and time.  Skin: Skin is warm. Rash noted.  Scattered urticarial rash on upper extremities b/l, lower extremities b/l and torso.   Psychiatric: She has a normal mood and affect. Her behavior is normal.  Nursing note and vitals reviewed.  Previous notes and tests were reviewed. The plan was reviewed  with the patient/family, and all questions/concerned were addressed.  It was my pleasure to see Ashley Turner today and participate in her care. Please feel free to contact me with any questions or concerns.  Sincerely,  Rexene Alberts, DO Allergy & Immunology  [facility] office: [phone] [facility] office: [place] office: [phone]

## 2019-01-27 NOTE — Assessment & Plan Note (Addendum)
Past history - 5 week history of urticaria, pruritus and episodes of angioedema. No specific triggers noted. Question if Norvasc may be contributing to her symptoms as when dose was increased 6 months ago she noticed milder symptoms of pruritus and erythema. Patient was on zyrtec 10mg  QD and Pepcid 20mg  BID with good benefit. Had a flare when stopped zyrtec requiring ER visit. Labwork unremarkable.  Interim history - Prednisone cleared up last flare but starting at the end of July having daily hives again despite taking antihistamines as below. Not as bas as before and no angioedema episodes. Off Norvasc now but BP not as controlled and hives still there.   Start Xolair 300mg  injections every 4 weeks.   Discussed risks and benefits of Xolair. Patient in agreement and would like to start.   Avoid the following potential triggers: alcohol, tight clothing, NSAIDs.   Continue zyrtec to 20mg  twice a day.   Continue Pepcid20mg  twice a day.  Continue Singulair 10mg  daily.  I have prescribed epinephrine injectable and demonstrated proper use. For mild symptoms you can take over the counter antihistamines such as Benadryl and monitor symptoms closely. If symptoms worsen or if you have severe symptoms including breathing issues, throat closure, significant swelling, whole body hives, severe diarrhea and vomiting, lightheadedness then inject epinephrine and seek immediate medical care afterwards.

## 2019-01-27 NOTE — Patient Instructions (Addendum)
Urticaria  Start Xolair 300mg  injections every 4 weeks.   Avoid the following potential triggers: alcohol, tight clothing, NSAIDs.   Continue zyrtec to 20mg  twice a day.   Continue Pepcid20mg  twice a day.  Continue Singulair 10mg  daily.  I have prescribed epinephrine injectable and demonstrated proper use. For mild symptoms you can take over the counter antihistamines such as Benadryl and monitor symptoms closely. If symptoms worsen or if you have severe symptoms including breathing issues, throat closure, significant swelling, whole body hives, severe diarrhea and vomiting, lightheadedness then inject epinephrine and seek immediate medical care afterwards.  Follow up with PCP regarding your blood pressure.  Follow up in 2 months

## 2019-01-27 NOTE — Assessment & Plan Note (Signed)
   No more angioedema episodes.

## 2019-01-27 NOTE — Telephone Encounter (Signed)
Called patient and advised process for approval, copay card and submit [facility]. Advised patient once pharmacy contactsfor delivery and date set I will reach out to advise to make appt to start therapy

## 2019-01-28 ENCOUNTER — Ambulatory Visit (INDEPENDENT_AMBULATORY_CARE_PROVIDER_SITE_OTHER): Payer: 59

## 2019-01-28 DIAGNOSIS — L509 Urticaria, unspecified: Secondary | ICD-10-CM

## 2019-01-28 DIAGNOSIS — L501 Idiopathic urticaria: Secondary | ICD-10-CM | POA: Diagnosis not present

## 2019-01-28 MED ORDER — OMALIZUMAB 150 MG ~~LOC~~ SOLR
300.0000 mg | SUBCUTANEOUS | Status: DC
  Administered 2019-01-28 – 2019-11-14 (×11): 300 mg via SUBCUTANEOUS

## 2019-01-28 NOTE — Progress Notes (Signed)
Immunotherapy   Patient Details  Name: [name] MRN: [id number] Date of Birth: 1971-01-30  01/28/2019  Ashley Turner started on a 300 mg sample of Xolair for Urticaria.  Patient waited 30 minutes in an exam room with no problems.  Frequency: Every 4 weeks Epi-Pen: Yes Consent signed and patient instructions given.   [name] 01/28/2019, 9:52 AM

## 2019-01-31 ENCOUNTER — Ambulatory Visit: Payer: 59 | Admitting: Allergy

## 2019-02-03 ENCOUNTER — Telehealth: Payer: Self-pay

## 2019-02-03 MED ORDER — PREDNISONE 10 MG PO TABS: ORAL_TABLET | ORAL | 0 refills | Status: DC

## 2019-02-03 NOTE — Telephone Encounter (Signed)
Please advise and thank you. 

## 2019-02-03 NOTE — Telephone Encounter (Signed)
Patient is calling stating her hives are out of control and she can not sleep. Patient is requesting a round of prednisone.  Please Advise.   [facility]

## 2019-02-03 NOTE — Telephone Encounter (Signed)
Patient was notified of Rx sent in to pharmacy, Advise patient that she needed to take her antihistamine as directed and patient can take an extra dose of antihistamine for breakthrough sx.

## 2019-02-03 NOTE — Telephone Encounter (Signed)
Sent in script. Make sure she is taking all her other antihistamines as well.  Thank you.

## 2019-02-04 ENCOUNTER — Other Ambulatory Visit: Payer: Self-pay

## 2019-02-04 MED ORDER — MONTELUKAST SODIUM 10 MG PO TABS: 10.0000 mg | ORAL_TABLET | Freq: Every day | ORAL | 5 refills | Status: DC

## 2019-02-05 ENCOUNTER — Other Ambulatory Visit: Payer: Self-pay | Admitting: Family Medicine

## 2019-02-05 DIAGNOSIS — L509 Urticaria, unspecified: Secondary | ICD-10-CM

## 2019-02-05 DIAGNOSIS — I10 Essential (primary) hypertension: Secondary | ICD-10-CM

## 2019-02-05 NOTE — Telephone Encounter (Signed)
Forwarding medication refill request to the clinical pool for review. 

## 2019-02-07 NOTE — Telephone Encounter (Signed)
Decline medication. Refill too soon and patient needs an OV for additional refills.

## 2019-02-21 NOTE — Telephone Encounter (Signed)
LVM to schedule appt

## 2019-02-24 NOTE — Telephone Encounter (Signed)
Pt scheduled appt via Mychart

## 2019-02-25 ENCOUNTER — Other Ambulatory Visit: Payer: Self-pay | Admitting: Family Medicine

## 2019-02-25 ENCOUNTER — Ambulatory Visit (INDEPENDENT_AMBULATORY_CARE_PROVIDER_SITE_OTHER): Payer: 59 | Admitting: *Deleted

## 2019-02-25 ENCOUNTER — Other Ambulatory Visit: Payer: Self-pay

## 2019-02-25 DIAGNOSIS — L509 Urticaria, unspecified: Secondary | ICD-10-CM

## 2019-02-25 DIAGNOSIS — L501 Idiopathic urticaria: Secondary | ICD-10-CM | POA: Diagnosis not present

## 2019-02-25 NOTE — Telephone Encounter (Signed)
Requested medication (s) are due for refill today: yes  Requested medication (s) are on the active medication list: yes  Last refill:  01/11/2019  Future visit scheduled: yes  Notes to clinic:  Review for refill  Requested Prescriptions  Pending Prescriptions Disp Refills   olmesartan (BENICAR) 20 MG tablet [Pharmacy Med Name: OLMESARTAN MEDOXOMIL 20 MG TAB] 30 tablet 2    Sig: TAKE 1 TABLET BY MOUTH EVERY DAY     Cardiovascular:  Angiotensin Receptor Blockers Failed - 02/25/2019  7:19 AM      Failed - Cr in normal range and within 180 days    Creatinine, Ser  Date Value Ref Range Status  05/10/2018 0.72 0.57 - 1.00 mg/dL Final         Failed - K in normal range and within 180 days    Potassium  Date Value Ref Range Status  05/10/2018 3.8 3.5 - 5.2 mmol/L Final         Failed - Last BP in normal range    BP Readings from Last 1 Encounters:  01/27/19 (!) 160/102         Failed - Valid encounter within last 6 months    Recent Outpatient Visits          8 months ago Essential hypertension   [facility] at [place], [place]   10 months ago Hives   Primary Care at [facility], Arlie Solomons, MD   1 year ago Essential hypertension   Primary Care at [facility], Arlie Solomons, MD   1 year ago Essential hypertension   Primary Care at [facility], [place], [place]   2 years ago Essential hypertension   Primary Care at [facility], Arlie Solomons, MD      Future Appointments            In 3 weeks Forrest Moron, MD Primary Care at [place], [place]   In 1 month Garnet Sierras, DO Allergy and [place] - Patient is not pregnant

## 2019-02-25 NOTE — Telephone Encounter (Signed)
Refilled medication for 30 days no refills. Patient has an OV 03/18/19.

## 2019-03-18 ENCOUNTER — Encounter: Payer: Self-pay | Admitting: Family Medicine

## 2019-03-18 ENCOUNTER — Ambulatory Visit: Payer: 59 | Admitting: Family Medicine

## 2019-03-18 ENCOUNTER — Other Ambulatory Visit: Payer: Self-pay

## 2019-03-18 VITALS — BP 135/87 | HR 73 | Temp 98.1°F | Resp 18 | Ht 63.0 in | Wt 195.2 lb

## 2019-03-18 DIAGNOSIS — L509 Urticaria, unspecified: Secondary | ICD-10-CM

## 2019-03-18 DIAGNOSIS — Z5181 Encounter for therapeutic drug level monitoring: Secondary | ICD-10-CM | POA: Diagnosis not present

## 2019-03-18 DIAGNOSIS — I10 Essential (primary) hypertension: Secondary | ICD-10-CM | POA: Diagnosis not present

## 2019-03-18 DIAGNOSIS — T783XXS Angioneurotic edema, sequela: Secondary | ICD-10-CM

## 2019-03-18 LAB — CMP14+EGFR
ALT: 12 IU/L (ref 0–32)
AST: 12 IU/L (ref 0–40)
Albumin/Globulin Ratio: 1.8 (ref 1.2–2.2)
Albumin: 4.2 g/dL (ref 3.8–4.8)
Alkaline Phosphatase: 40 IU/L (ref 39–117)
BUN/Creatinine Ratio: 15 (ref 9–23)
BUN: 13 mg/dL (ref 6–24)
Bilirubin Total: 0.3 mg/dL (ref 0.0–1.2)
CO2: 20 mmol/L (ref 20–29)
Calcium: 9.9 mg/dL (ref 8.7–10.2)
Chloride: 108 mmol/L — ABNORMAL HIGH (ref 96–106)
Creatinine, Ser: 0.87 mg/dL (ref 0.57–1.00)
GFR calc Af Amer: 92 mL/min/{1.73_m2} (ref >59)
GFR calc non Af Amer: 80 mL/min/{1.73_m2} (ref >59)
Globulin, Total: 2.4 g/dL (ref 1.5–4.5)
Glucose: 85 mg/dL (ref 65–99)
Potassium: 4.2 mmol/L (ref 3.5–5.2)
Sodium: 142 mmol/L (ref 134–144)
Total Protein: 6.6 g/dL (ref 6.0–8.5)

## 2019-03-18 MED ORDER — AMLODIPINE BESYLATE 10 MG PO TABS: 10.0000 mg | ORAL_TABLET | Freq: Every day | ORAL | 3 refills | Status: DC

## 2019-03-18 NOTE — Patient Instructions (Signed)
° ° ° °  If you have lab work done today you will be contacted with your lab results within the next 2 weeks.  If you have not heard from us then please contact us. The fastest way to get your results is to register for My Chart. ° ° °IF you received an x-ray today, you will receive an invoice from [facility]. Please contact [facility] at [phone] with questions or concerns regarding your invoice.  ° °IF you received labwork today, you will receive an invoice from [facility]. Please contact [facility] at [phone] with questions or concerns regarding your invoice.  ° °Our billing staff will not be able to assist you with questions regarding bills from these companies. ° °You will be contacted with the lab results as soon as they are available. The fastest way to get your results is to activate your My Chart account. Instructions are located on the last page of this paperwork. If you have not heard from us regarding the results in 2 weeks, please contact this office. °  ° ° ° °

## 2019-03-18 NOTE — Progress Notes (Signed)
Established Patient Office Visit  Subjective:  Patient ID: Ashley Turner, female    DOB: 04-Apr-1971  Age: 48 y.o. MRN: [id number]  CC:  Chief Complaint  Patient presents with  . blood pressure check    states it has been high while visiting other doc offices  . Medication Refill    Olmesartan    HPI [name] presents for blood pressure follow up   Hypertension and Medication Monitoring The patient was taking off Amlodipine due to urticaria and angioedema This was done in November 2019 She was switched to Benicar  Her experience while on Benicar has been  That her blood pressure is higher on the benicar compared to the amlodipine.   Angioedema and Urticaria She reports that she goes to get Xolair injections as often as  Her last round of steroid was September   She continues to have hives since being on Xolair and antihistamines Her hives are lessening.  Past Medical History:  Diagnosis Date  . Angio-edema   . Heart murmur   . HTN (hypertension)   . Urticaria     Past Surgical History:  Procedure Laterality Date  . Tubes in ears Bilateral   . TYMPANOSTOMY TUBE PLACEMENT      Family History  Problem Relation Age of Onset  . Hyperlipidemia Mother   . Hypertension Mother   . Cancer Father   . Hypertension Father   . Stroke Father   . Hypertension Sister     Social History   Socioeconomic History  . Marital status: Single    Spouse name: Not on file  . Number of children: 1  . Years of education: Not on file  . Highest education level: Some college, no degree  Occupational History  . Occupation: inside Scientist, clinical (histocompatibility and immunogenetics): [employer]  . Financial resource strain: Not on file  . Food insecurity    Worry: Not on file    Inability: Not on file  . Transportation needs    Medical: Not on file    Non-medical: Not on file  Tobacco Use  . Smoking status: Never Smoker  . Smokeless tobacco: Never Used  Substance and  Sexual Activity  . Alcohol use: Yes    Alcohol/week: 2.0 standard drinks    Types: 2 Cans of beer per week    Comment: rare weekends  . Drug use: No  . Sexual activity: Not on file  Lifestyle  . Physical activity    Days per week: Not on file    Minutes per session: Not on file  . Stress: Not on file  Relationships  . Social Herbalist on phone: Not on file    Gets together: Not on file    Attends religious service: Not on file    Active member of club or organization: Not on file    Attends meetings of clubs or organizations: Not on file    Relationship status: Not on file  . Intimate partner violence    Fear of current or ex partner: Not on file    Emotionally abused: Not on file    Physically abused: Not on file    Forced sexual activity: Not on file  Other Topics Concern  . Not on file  Social History Narrative   Lives with her daughter.   Mother and sister live in [place].    Outpatient Medications Prior to Visit  Medication Sig Dispense  Refill  . BLISOVI 24 FE 1-20 MG-MCG(24) tablet Take 1 tablet by mouth daily.     Marland Kitchen CALCIUM PO Take 1 tablet by mouth daily.    . cetirizine (ZYRTEC) 10 MG tablet Take 20 mg by mouth 2 (two) times daily.    . Cholecalciferol (VITAMIN D PO) Take 1 tablet by mouth daily.    Marland Kitchen EPINEPHrine (AUVI-Q) 0.3 mg/0.3 mL IJ SOAJ injection Inject 0.3 mLs (0.3 mg total) into the muscle as needed for anaphylaxis. 2 each 1  . famotidine (PEPCID) 20 MG tablet Take 1 tablet (20 mg total) by mouth 2 (two) times daily. 60 tablet 5  . montelukast (SINGULAIR) 10 MG tablet Take 1 tablet (10 mg total) by mouth daily. 30 tablet 5  . Multiple Vitamin (MULTIVITAMIN WITH MINERALS) TABS tablet Take 1 tablet by mouth daily.    Marland Kitchen olmesartan (BENICAR) 20 MG tablet TAKE 1 TABLET BY MOUTH EVERY DAY 30 tablet 0  . Omalizumab (XOLAIR [place]) Inject into the skin. Every 4 weeks in each arm    . predniSONE (DELTASONE) 10 MG tablet Take prednisone 23m daily x 2  days, 350mdaily x 2 days, 2018maily x 2 days and 98m54mily x 2 days. 20 tablet 0   Facility-Administered Medications Prior to Visit  Medication Dose Route Frequency Provider Last Rate Last Dose  . omalizumab (XOLArvid Rightjection 300 mg  300 mg Subcutaneous Q28 days Kim,Garnet Sierras   300 mg at 02/25/19 1002    Allergies  Allergen Reactions  . Codeine Nausea And Vomiting    ROS Review of Systems Review of Systems  Constitutional: Negative for activity change, appetite change, chills and fever.  HENT: Negative for congestion, nosebleeds, trouble swallowing and voice change.   Respiratory: Negative for cough, shortness of breath and wheezing.   Gastrointestinal: Negative for diarrhea, nausea and vomiting.  Genitourinary: Negative for difficulty urinating, dysuria, flank pain and hematuria.  Musculoskeletal: Negative for back pain, joint swelling and neck pain.  Neurological: Negative for dizziness, speech difficulty, light-headedness and numbness.  See HPI. All other review of systems negative.     Objective:    Physical Exam  BP 135/87   Pulse 73   Temp 98.1 F (36.7 C) (Oral)   Resp 18   Ht 5' 3"  (1.6 m)   Wt 195 lb 3.2 oz (88.5 kg)   SpO2 96%   BMI 34.58 kg/m  Wt Readings from Last 3 Encounters:  03/18/19 195 lb 3.2 oz (88.5 kg)  06/29/18 177 lb 3.2 oz (80.4 kg)  06/07/18 173 lb (78.5 kg)   Physical Exam  Constitutional: Oriented to person, place, and time. Appears well-developed and well-nourished.  HENT:  Head: Normocephalic and atraumatic.  Eyes: Conjunctivae and EOM are normal.  Cardiovascular: Normal rate, regular rhythm, normal heart sounds and intact distal pulses.  No murmur heard. Pulmonary/Chest: Effort normal and breath sounds normal. No stridor. No respiratory distress. Has no wheezes.  Neurological: Is alert and oriented to person, place, and time.  Skin: Skin is warm. Capillary refill takes less than 2 seconds. Numerous hives without any streaking or  edema. Psychiatric: Has a normal mood and affect. Behavior is normal. Judgment and thought content normal.    Health Maintenance Due  Topic Date Due  . HIV Screening  04/23/1986    There are no preventive care reminders to display for this patient.  Lab Results  Component Value Date   TSH 2.010 05/10/2018   Lab Results  Component  Value Date   WBC 17.1 (H) 05/10/2018   HGB 13.7 05/10/2018   HCT 41.2 05/10/2018   MCV 85 05/10/2018   PLT 349 05/10/2018   Lab Results  Component Value Date   NA 141 05/10/2018   K 3.8 05/10/2018   CO2 19 (L) 05/10/2018   GLUCOSE 103 (H) 05/10/2018   BUN 13 05/10/2018   CREATININE 0.72 05/10/2018   BILITOT <0.2 05/10/2018   ALKPHOS 36 (L) 05/10/2018   AST 17 05/10/2018   ALT 13 05/10/2018   PROT 6.3 05/10/2018   ALBUMIN 4.0 05/10/2018   CALCIUM 8.7 05/10/2018   Lab Results  Component Value Date   CHOL 198 08/28/2017   Lab Results  Component Value Date   HDL 63 08/28/2017   Lab Results  Component Value Date   LDLCALC 120 (H) 08/28/2017   Lab Results  Component Value Date   TRIG 76 08/28/2017   Lab Results  Component Value Date   CHOLHDL 3.1 08/28/2017   Lab Results  Component Value Date   HGBA1C 5.2 11/07/2016      Assessment & Plan:   Problem List Items Addressed This Visit      Cardiovascular and Mediastinum   Essential hypertension - discussed DASH diet and exercise Discussed amlodipine since it did not cause the angioedema She prefers amlodipine   Relevant Medications   amLODipine (NORVASC) 10 MG tablet   Other Relevant Orders   CMP14+EGFR     Musculoskeletal and Integument   Urticaria - Primary  - continue with Allergy for Immunotherapy   Relevant Orders   CMP14+EGFR     Other   Angio-edema   Relevant Orders   CMP14+EGFR    Other Visit Diagnoses    Encounter for medication monitoring    - resumed amlodipine and discontinue benicar She had better bp control with amlodipine   Relevant Orders    CMP14+EGFR      Meds ordered this encounter  Medications  . amLODipine (NORVASC) 10 MG tablet    Sig: Take 1 tablet (10 mg total) by mouth daily.    Dispense:  90 tablet    Refill:  3    Follow-up: No follow-ups on file.    Forrest Moron, MD

## 2019-03-23 ENCOUNTER — Other Ambulatory Visit: Payer: Self-pay | Admitting: Family Medicine

## 2019-03-23 NOTE — Telephone Encounter (Signed)
Requested medication (s) are due for refill today: no  Requested medication (s) are on the active medication list: no  Last refill:  02/25/2019  Future visit scheduled: no  Notes to clinic:  Medication was discontinued   Requested Prescriptions  Pending Prescriptions Disp Refills   olmesartan (BENICAR) 20 MG tablet [Pharmacy Med Name: OLMESARTAN MEDOXOMIL 20 MG TAB] 30 tablet 0    Sig: TAKE 1 TABLET BY MOUTH EVERY DAY     Cardiovascular:  Angiotensin Receptor Blockers Passed - 03/23/2019  1:16 AM      Passed - Cr in normal range and within 180 days    Creatinine, Ser  Date Value Ref Range Status  03/18/2019 0.87 0.57 - 1.00 mg/dL Final         Passed - K in normal range and within 180 days    Potassium  Date Value Ref Range Status  03/18/2019 4.2 3.5 - 5.2 mmol/L Final         Passed - Patient is not pregnant      Passed - Last BP in normal range    BP Readings from Last 1 Encounters:  03/18/19 135/87         Passed - Valid encounter within last 6 months    Recent Outpatient Visits          5 days ago Urticaria   Primary Care at [facility], Arlie Solomons, MD   8 months ago Essential hypertension   Primary Care at [facility], Arlie Solomons, MD   11 months ago Hives   Primary Care at [facility], Arlie Solomons, MD   1 year ago Essential hypertension   Primary Care at [facility], Arlie Solomons, MD   1 year ago Essential hypertension   Primary Care at [facility], [place], [place]      Future Appointments            In 2 weeks Garnet Sierras, DO Allergy and [place]

## 2019-03-25 ENCOUNTER — Other Ambulatory Visit: Payer: Self-pay

## 2019-03-25 ENCOUNTER — Ambulatory Visit (INDEPENDENT_AMBULATORY_CARE_PROVIDER_SITE_OTHER): Payer: 59

## 2019-03-25 DIAGNOSIS — L501 Idiopathic urticaria: Secondary | ICD-10-CM

## 2019-03-25 DIAGNOSIS — L509 Urticaria, unspecified: Secondary | ICD-10-CM

## 2019-04-06 ENCOUNTER — Ambulatory Visit: Payer: 59 | Admitting: Allergy

## 2019-04-06 ENCOUNTER — Other Ambulatory Visit: Payer: Self-pay

## 2019-04-06 ENCOUNTER — Encounter: Payer: Self-pay | Admitting: Allergy

## 2019-04-06 VITALS — BP 124/80 | HR 86 | Temp 98.2°F | Resp 16 | Ht 64.17 in | Wt 195.0 lb

## 2019-04-06 DIAGNOSIS — T783XXD Angioneurotic edema, subsequent encounter: Secondary | ICD-10-CM

## 2019-04-06 DIAGNOSIS — L509 Urticaria, unspecified: Secondary | ICD-10-CM

## 2019-04-06 MED ORDER — HYDROXYZINE HCL 25 MG PO TABS: ORAL_TABLET | ORAL | 5 refills | Status: DC

## 2019-04-06 NOTE — Patient Instructions (Addendum)
Urticaria  Continue Xolair 300mg  injections every 4 weeks.   Avoid the following potential triggers: alcohol, tight clothing, NSAIDs.  Continuezyrtec to 20mg  twice a day.   ContinuePepcid20mg  twice a day.  Continue Singulair 10mg  daily.  May use hydroxyzine 25mg  tablet 1/2 tablet to 1 tablet 1 hour before bedtime as needed for itching.  For mild symptoms you can take over the counter antihistamines such as Benadryl and monitor symptoms closely. If symptoms worsen or if you have severe symptoms including breathing issues, throat closure, significant swelling, whole body hives, severe diarrhea and vomiting, lightheadedness then inject epinephrine and seek immediate medical care afterwards.  Follow up in 3 months or sooner if needed.

## 2019-04-06 NOTE — Progress Notes (Signed)
Follow Up Note  RE: Ashley Turner MRN: [id number] DOB: 20-Oct-1970 Date of Office Visit: 04/06/2019  Referring provider: Forrest Moron, MD Primary care provider: Forrest Moron, MD  Chief Complaint: Urticaria (Xolair follow up)  History of Present Illness: I had the pleasure of seeing Ashley Turner for a follow up visit at the [facility] on 04/06/2019. She is a 48 y.o. female, who is being followed for urticaria and angioedema. Today she is here for regular follow up visit. Her previous allergy office visit was on 01/27/2019 with Dr. Maudie Mercury.   Urticaria/angioedema Currently on Xolair 300mg  every 4 weeks and doing better than before but she is still having a few daily hives. No angioedema episodes. Had 3 injections to date. About 50-60% improvement with Xolair.  Symptoms seem to be worse in the morning and at night.   Still taking zyrtec 20mg  BID, Pepcid 20mg  BID, Singulair daily. No oral prednisone since the last visit.  Patient is back on amlodipine for the past month and with no flare in hives.  Assessment and Plan: Ashley Turner is a 48 y.o. female with: Urticaria Past history - 5 week history of urticaria, pruritus and episodes of angioedema. No triggers noted. Question if Norvasc may be contributing to her symptoms as when dose was increased 6 months ago she noticed milder symptoms of pruritus and erythema. Patient was on zyrtec 10mg  QD and Pepcid 20mg  BID with good benefit. Had a flare when stopped zyrtec requiring ER visit. Labwork unremarkable.  Interim history - started on Xolair 300mg  Q4 weeks and had 3 injections with 50-60% improvement in symptoms but still has a few daily hives. Worse itching at night and in the mornings. Restart Norvasc last month with no worsening symptoms.  Continue Xolair 300mg  injections every 4 weeks.   Avoid the following potential triggers: alcohol, tight clothing, NSAIDs.  Continuezyrtec to 20mg  twice a day.   ContinuePepcid20mg   twice a day.  Continue Singulair 10mg  daily.  May use hydroxyzine 25mg  tablet 1/2 tablet to 1 tablet 1 hour before bedtime as needed for itching.  For mild symptoms you can take over the counter antihistamines such as Benadryl and monitor symptoms closely. If symptoms worsen or if you have severe symptoms including breathing issues, throat closure, significant swelling, whole body hives, severe diarrhea and vomiting, lightheadedness then inject epinephrine and seek immediate medical care afterwards.  Angio-edema  No more angioedema episodes.  Return in about 3 months (around 07/07/2019).  Meds ordered this encounter  Medications  . hydrOXYzine (ATARAX/VISTARIL) 25 MG tablet    Sig: Take 1/2 tablet to 1 tablet at night as needed for itching.    Dispense:  30 tablet    Refill:  5   Diagnostics: None.  Medication List:  Current Outpatient Medications  Medication Sig Dispense Refill  . amLODipine (NORVASC) 10 MG tablet Take 1 tablet (10 mg total) by mouth daily. 90 tablet 3  . BLISOVI 24 FE 1-20 MG-MCG(24) tablet Take 1 tablet by mouth daily.     Marland Kitchen CALCIUM PO Take 1 tablet by mouth daily.    . cetirizine (ZYRTEC) 10 MG tablet Take 20 mg by mouth 2 (two) times daily.    . Cholecalciferol (VITAMIN D PO) Take 1 tablet by mouth daily.    Marland Kitchen EPINEPHrine (AUVI-Q) 0.3 mg/0.3 mL IJ SOAJ injection Inject 0.3 mLs (0.3 mg total) into the muscle as needed for anaphylaxis. 2 each 1  . famotidine (PEPCID) 20 MG tablet Take  1 tablet (20 mg total) by mouth 2 (two) times daily. 60 tablet 5  . montelukast (SINGULAIR) 10 MG tablet Take 1 tablet (10 mg total) by mouth daily. 30 tablet 5  . Multiple Vitamin (MULTIVITAMIN WITH MINERALS) TABS tablet Take 1 tablet by mouth daily.    . Omalizumab (XOLAIR [place]) Inject into the skin. Every 4 weeks in each arm    . hydrOXYzine (ATARAX/VISTARIL) 25 MG tablet Take 1/2 tablet to 1 tablet at night as needed for itching. 30 tablet 5   Current Facility-Administered  Medications  Medication Dose Route Frequency Provider Last Rate Last Dose  . omalizumab Arvid Right) injection 300 mg  300 mg Subcutaneous Q28 days Garnet Sierras, DO   300 mg at 03/25/19 1006   Allergies: Allergies  Allergen Reactions  . Codeine Nausea And Vomiting   I reviewed her past medical history, social history, family history, and environmental history and no significant changes have been reported from her previous visit.  Review of Systems  Constitutional: Negative for appetite change, chills, fever and unexpected weight change.  HENT: Negative for congestion and rhinorrhea.   Eyes: Negative for itching.  Respiratory: Negative for cough, chest tightness, shortness of breath and wheezing.   Cardiovascular: Negative for chest pain.  Gastrointestinal: Negative for abdominal pain.  Genitourinary: Negative for difficulty urinating.  Skin: Positive for rash.  Allergic/Immunologic: Negative for environmental allergies and food allergies.  Neurological: Negative for headaches.   Objective: BP 124/80 (BP Location: Right Arm, Patient Position: Sitting, Cuff Size: Normal)   Pulse 86   Temp 98.2 F (36.8 C) (Temporal)   Resp 16   Ht 5' 4.17" (1.63 m)   Wt 195 lb (88.5 kg)   SpO2 96%   BMI 33.29 kg/m  Body mass index is 33.29 kg/m. Physical Exam  Constitutional: She is oriented to person, place, and time. She appears well-developed and well-nourished.  HENT:  Head: Normocephalic and atraumatic.  Right Ear: External ear normal.  Left Ear: External ear normal.  Nose: Nose normal.  Mouth/Throat: Oropharynx is clear and moist.  Eyes: Conjunctivae and EOM are normal.  Neck: Neck supple.  Cardiovascular: Normal rate, regular rhythm and normal heart sounds. Exam reveals no gallop and no friction rub.  No murmur heard. Pulmonary/Chest: Effort normal and breath sounds normal. She has no wheezes. She has no rales.  Abdominal: Soft.  Neurological: She is alert and oriented to person,  place, and time.  Skin: Skin is warm. Rash noted.  Few urticarial rash on right forearm.  Psychiatric: She has a normal mood and affect. Her behavior is normal.  Nursing note and vitals reviewed.  Previous notes and tests were reviewed. The plan was reviewed with the patient/family, and all questions/concerned were addressed.  It was my pleasure to see Ashley Turner today and participate in her care. Please feel free to contact me with any questions or concerns.  Sincerely,  Rexene Alberts, DO Allergy & Immunology  [facility] office: [phone] [facility] office: [place] office: [phone]

## 2019-04-06 NOTE — Assessment & Plan Note (Signed)
   No more angioedema episodes. 

## 2019-04-06 NOTE — Assessment & Plan Note (Signed)
Past history - 5 week history of urticaria, pruritus and episodes of angioedema. No triggers noted. Question if Norvasc may be contributing to her symptoms as when dose was increased 6 months ago she noticed milder symptoms of pruritus and erythema. Patient was on zyrtec 10mg  QD and Pepcid 20mg  BID with good benefit. Had a flare when stopped zyrtec requiring ER visit. Labwork unremarkable.  Interim history - started on Xolair 300mg  Q4 weeks and had 3 injections with 50-60% improvement in symptoms but still has a few daily hives. Worse itching at night and in the mornings. Restart Norvasc last month with no worsening symptoms.  Continue Xolair 300mg  injections every 4 weeks.   Avoid the following potential triggers: alcohol, tight clothing, NSAIDs.  Continuezyrtec to 20mg  twice a day.   ContinuePepcid20mg  twice a day.  Continue Singulair 10mg  daily.  May use hydroxyzine 25mg  tablet 1/2 tablet to 1 tablet 1 hour before bedtime as needed for itching.  For mild symptoms you can take over the counter antihistamines such as Benadryl and monitor symptoms closely. If symptoms worsen or if you have severe symptoms including breathing issues, throat closure, significant swelling, whole body hives, severe diarrhea and vomiting, lightheadedness then inject epinephrine and seek immediate medical care afterwards.

## 2019-04-22 ENCOUNTER — Ambulatory Visit: Payer: Self-pay

## 2019-04-25 ENCOUNTER — Telehealth: Payer: Self-pay | Admitting: Allergy

## 2019-04-25 ENCOUNTER — Ambulatory Visit (INDEPENDENT_AMBULATORY_CARE_PROVIDER_SITE_OTHER): Payer: 59

## 2019-04-25 ENCOUNTER — Other Ambulatory Visit: Payer: Self-pay

## 2019-04-25 DIAGNOSIS — L509 Urticaria, unspecified: Secondary | ICD-10-CM

## 2019-04-25 DIAGNOSIS — L501 Idiopathic urticaria: Secondary | ICD-10-CM | POA: Diagnosis not present

## 2019-04-25 NOTE — Telephone Encounter (Signed)
Ignore message. She already came in.

## 2019-04-25 NOTE — Telephone Encounter (Signed)
Please call patient. She called about Xolair injection and getting it mixed.

## 2019-05-13 ENCOUNTER — Telehealth: Payer: Self-pay | Admitting: *Deleted

## 2019-05-13 NOTE — Telephone Encounter (Signed)
-----   Message from [place] sent at 05/12/2019  9:37 AM EST ----- Regarding: Xolair reimbursement Good morning, this patient called today about her charges for Xolair. She states she is only supposed to pay $10 for each xolair injection but pt's ins applying injection to deductible. She may have spoken to you about getting reimbursement forms filled out with [employer] to get her money back. she is following up to see if she has to fill those forms out herself? She keeps getting billed $113 per xolair injection.  [place] [phone]

## 2019-05-13 NOTE — Telephone Encounter (Signed)
Called and advised after multiple calls and uploads got copay card to pay for admin for Aug and Sept and working on Oct presently. Will be mailing her receipt

## 2019-05-23 ENCOUNTER — Ambulatory Visit (INDEPENDENT_AMBULATORY_CARE_PROVIDER_SITE_OTHER): Payer: 59

## 2019-05-23 ENCOUNTER — Other Ambulatory Visit: Payer: Self-pay

## 2019-05-23 DIAGNOSIS — L509 Urticaria, unspecified: Secondary | ICD-10-CM

## 2019-05-23 DIAGNOSIS — L501 Idiopathic urticaria: Secondary | ICD-10-CM | POA: Diagnosis not present

## 2019-06-20 ENCOUNTER — Ambulatory Visit (INDEPENDENT_AMBULATORY_CARE_PROVIDER_SITE_OTHER): Payer: 59

## 2019-06-20 ENCOUNTER — Other Ambulatory Visit: Payer: Self-pay

## 2019-06-20 DIAGNOSIS — L501 Idiopathic urticaria: Secondary | ICD-10-CM

## 2019-06-20 DIAGNOSIS — L509 Urticaria, unspecified: Secondary | ICD-10-CM

## 2019-07-11 ENCOUNTER — Encounter: Payer: Self-pay | Admitting: Allergy

## 2019-07-11 ENCOUNTER — Other Ambulatory Visit: Payer: Self-pay

## 2019-07-11 ENCOUNTER — Ambulatory Visit: Payer: 59 | Admitting: Allergy

## 2019-07-11 VITALS — BP 132/86 | HR 102 | Temp 97.9°F | Ht 64.0 in

## 2019-07-11 DIAGNOSIS — L509 Urticaria, unspecified: Secondary | ICD-10-CM

## 2019-07-11 NOTE — Assessment & Plan Note (Signed)
Past history - 5 week history of urticaria, pruritus and episodes of angioedema. No triggers noted. Labwork unremarkable. 50-60% improvement with Xolair.  Interim history - 1 week ago started to have few hives, perhaps due to increased stress at work but no other changes in her life. Noticing some itching a few days before due for Xolair. No angioedema episodes.  Continue Xolair 300mg  injections every 4 weeks.  Monitor symptoms and see if you need to come in a few days before every 28 days.   If you are still broken out or the hives are getting worse let us know. We may need to use a little prednisone to calm this flare episode.   Avoid the following potential triggers: alcohol, tight clothing, NSAIDs.  Continuezyrtec to 20mg  twice a day.   ContinuePepcid20mg  twice a day.  Continue Singulair 10mg  daily.  May use hydroxyzine 25mg  tablet 1/2 tablet to 1 tablet 1 hour before bedtime as needed for itching.  For mild symptoms you can take over the counter antihistamines such as Benadryl and monitor symptoms closely. If symptoms worsen or if you have severe symptoms including breathing issues, throat closure, significant swelling, whole body hives, severe diarrhea and vomiting, lightheadedness then inject epinephrine and seek immediate medical care afterwards.

## 2019-07-11 NOTE — Progress Notes (Signed)
Follow Up Note  RE: Ashley Turner MRN: [id number] DOB: 1970/10/15 Date of Office Visit: 07/11/2019  Referring provider: Forrest Moron, MD Primary care provider: Forrest Moron, MD  Chief Complaint: Urticaria  History of Present Illness: I had the pleasure of seeing Ashley Turner for a follow up visit at the [facility] [place] [facility] [place] on 07/11/2019. She is a 49 y.o. female, who is being followed for chronic urticaria. Her previous allergy office visit was on 04/06/2019 with Dr. Maudie Mercury. Today is a regular follow up visit.  Urticaria Patient was doing well with no flares after the last OV but last Tuesday she broke out in hives on her arms, back, legs.  Currently on Xolair 300mg  every 4 weeks, zyrtec 20mg  BID, Pepcid 20mg  BID, Singulair daily.   Patient is back on amlodipine.   Increased stress at work due to busy season but denies any other triggers.   She has noticed that a few days prior to her Xolair injections she gets slightly itchy. No angioedema episodes.   Assessment and Plan: Ashley Turner is a 49 y.o. female with: No problem-specific Assessment & Plan notes found for this encounter.  Return in about 4 months (around 11/08/2019).  Diagnostics: None.  Medication List:  Current Outpatient Medications  Medication Sig Dispense Refill  . amLODipine (NORVASC) 10 MG tablet Take 1 tablet (10 mg total) by mouth daily. 90 tablet 3  . BLISOVI 24 FE 1-20 MG-MCG(24) tablet Take 1 tablet by mouth daily.     Marland Kitchen CALCIUM PO Take 1 tablet by mouth daily.    . cetirizine (ZYRTEC) 10 MG tablet Take 20 mg by mouth 2 (two) times daily.    . Cholecalciferol (VITAMIN D PO) Take 1 tablet by mouth daily.    Marland Kitchen EPINEPHrine (AUVI-Q) 0.3 mg/0.3 mL IJ SOAJ injection Inject 0.3 mLs (0.3 mg total) into the muscle as needed for anaphylaxis. 2 each 1  . famotidine (PEPCID) 20 MG tablet Take 1 tablet (20 mg total) by mouth 2 (two) times daily. 60 tablet 5  . hydrOXYzine (ATARAX/VISTARIL) 25 MG tablet Take 1/2  tablet to 1 tablet at night as needed for itching. 30 tablet 5  . montelukast (SINGULAIR) 10 MG tablet Take 1 tablet (10 mg total) by mouth daily. 30 tablet 5  . Multiple Vitamin (MULTIVITAMIN WITH MINERALS) TABS tablet Take 1 tablet by mouth daily.    . Omalizumab (XOLAIR [place]) Inject into the skin. Every 4 weeks in each arm     Current Facility-Administered Medications  Medication Dose Route Frequency Provider Last Rate Last Admin  . omalizumab Arvid Right) injection 300 mg  300 mg Subcutaneous Q28 days Garnet Sierras, DO   300 mg at 06/20/19 [id number]   Allergies: Allergies  Allergen Reactions  . Codeine Nausea And Vomiting   I reviewed her past medical history, social history, family history, and environmental history and no significant changes have been reported from her previous visit.  Review of Systems  Constitutional: Negative for appetite change, chills, fever and unexpected weight change.  HENT: Negative for congestion and rhinorrhea.   Eyes: Negative for itching.  Respiratory: Negative for cough, chest tightness, shortness of breath and wheezing.   Cardiovascular: Negative for chest pain.  Gastrointestinal: Negative for abdominal pain.  Genitourinary: Negative for difficulty urinating.  Skin: Positive for rash.  Allergic/Immunologic: Negative for environmental allergies and food allergies.  Neurological: Negative for headaches.   Objective: BP 132/86   Pulse (!) 102   Temp 97.9  F (36.6 C) (Temporal)   Ht 5\' 4"  (1.626 m)   SpO2 98%   BMI 33.47 kg/m  Body mass index is 33.47 kg/m. Physical Exam  Constitutional: She is oriented to person, place, and time. She appears well-developed and well-nourished.  HENT:  Head: Normocephalic and atraumatic.  Right Ear: External ear normal.  Left Ear: External ear normal.  Nose: Nose normal.  Mouth/Throat: Oropharynx is clear and moist.  Eyes: Conjunctivae and EOM are normal.  Cardiovascular: Normal rate, regular rhythm and normal heart  sounds. Exam reveals no gallop and no friction rub.  No murmur heard. Pulmonary/Chest: Effort normal and breath sounds normal. She has no wheezes. She has no rales.  Abdominal: Soft.  Musculoskeletal:     Cervical back: Neck supple.  Neurological: She is alert and oriented to person, place, and time.  Skin: Skin is warm. Rash noted.  Few urticarial rash on wrists b/l.  Psychiatric: She has a normal mood and affect. Her behavior is normal.  Nursing note and vitals reviewed.  Previous notes and tests were reviewed. The plan was reviewed with the patient/family, and all questions/concerned were addressed.  It was my pleasure to see Ashley Turner today and participate in her care. Please feel free to contact me with any questions or concerns.  Sincerely,  Rexene Alberts, DO Allergy & Immunology  [facility] office: [phone] [facility] office: [place] office: [phone]

## 2019-07-11 NOTE — Patient Instructions (Addendum)
Urticaria  Continue Xolair 300mg  injections every 4 weeks.  Monitor symptoms and see if you need to come in a few days before every 4 weeks.   If you are still broken out or the hives are getting worse let us know. We may need to use a little prednisone to calm this down.  Avoid the following potential triggers: alcohol, tight clothing, NSAIDs.  Continuezyrtec to 20mg  twice a day.   ContinuePepcid20mg  twice a day.  Continue Singulair 10mg  daily.  May use hydroxyzine 25mg  tablet 1/2 tablet to 1 tablet 1 hour before bedtime as needed for itching.  For mild symptoms you can take over the counter antihistamines such as Benadryl and monitor symptoms closely. If symptoms worsen or if you have severe symptoms including breathing issues, throat closure, significant swelling, whole body hives, severe diarrhea and vomiting, lightheadedness then inject epinephrine and seek immediate medical care afterwards.   Follow up in 4 months or sooner if needed.

## 2019-07-18 ENCOUNTER — Other Ambulatory Visit: Payer: Self-pay

## 2019-07-18 ENCOUNTER — Ambulatory Visit (INDEPENDENT_AMBULATORY_CARE_PROVIDER_SITE_OTHER): Payer: 59

## 2019-07-18 DIAGNOSIS — L501 Idiopathic urticaria: Secondary | ICD-10-CM

## 2019-07-18 DIAGNOSIS — L509 Urticaria, unspecified: Secondary | ICD-10-CM

## 2019-08-05 ENCOUNTER — Other Ambulatory Visit: Payer: Self-pay | Admitting: Allergy and Immunology

## 2019-08-15 ENCOUNTER — Ambulatory Visit: Payer: Self-pay

## 2019-08-22 ENCOUNTER — Ambulatory Visit: Payer: Self-pay

## 2019-08-23 ENCOUNTER — Ambulatory Visit (INDEPENDENT_AMBULATORY_CARE_PROVIDER_SITE_OTHER): Payer: 59 | Admitting: *Deleted

## 2019-08-23 ENCOUNTER — Other Ambulatory Visit: Payer: Self-pay

## 2019-08-23 DIAGNOSIS — L509 Urticaria, unspecified: Secondary | ICD-10-CM

## 2019-09-19 ENCOUNTER — Ambulatory Visit (INDEPENDENT_AMBULATORY_CARE_PROVIDER_SITE_OTHER): Payer: 59

## 2019-09-19 ENCOUNTER — Other Ambulatory Visit: Payer: Self-pay

## 2019-09-19 DIAGNOSIS — L501 Idiopathic urticaria: Secondary | ICD-10-CM

## 2019-09-19 DIAGNOSIS — L509 Urticaria, unspecified: Secondary | ICD-10-CM

## 2019-09-20 ENCOUNTER — Ambulatory Visit: Payer: Self-pay

## 2019-10-17 ENCOUNTER — Other Ambulatory Visit: Payer: Self-pay

## 2019-10-17 ENCOUNTER — Ambulatory Visit (INDEPENDENT_AMBULATORY_CARE_PROVIDER_SITE_OTHER): Payer: 59

## 2019-10-17 DIAGNOSIS — L501 Idiopathic urticaria: Secondary | ICD-10-CM | POA: Diagnosis not present

## 2019-10-17 DIAGNOSIS — L509 Urticaria, unspecified: Secondary | ICD-10-CM

## 2019-11-09 ENCOUNTER — Ambulatory Visit: Payer: 59 | Admitting: Allergy

## 2019-11-13 NOTE — Progress Notes (Signed)
Follow Up Note  RE: ARMONEE Turner MRN: [id number] DOB: 04/04/1971 Date of Office Visit: 11/14/2019  Referring provider: Forrest Moron, MD Primary care provider: Forrest Moron, MD  Chief Complaint: Urticaria (current flare up on both arms and legs)  History of Present Illness: I had the pleasure of seeing Ashley Turner for a follow up visit at the [facility] [place] [facility] [place] on 11/14/2019. She is a 49 y.o. female, who is being followed for chronic urticaria. Her previous allergy office visit was on 07/11/2019 with Dr. Maudie Mercury. Today is a regular follow up visit. Up to date with COVID-19 vaccine: no  Hives: Still having some mild hives on the arms and legs daily. Today is an average day.  It's worse in the mornings and nights.   Currently on Xolair 300mg  injections every 4 weeks. Sometimes hives worse when she is due for injection.   Still taking zyrtec 20mg  BID, Pepcid 20mg  Bid and Singulair daily.  Assessment and Plan: [name] is a 49 y.o. female with: Urticaria Past history - 5 week history of urticaria, pruritus and episodes of angioedema. No triggers noted. Labwork unremarkable. 50-60% improvement with Xolair.  Interim history - still having a few daily hives on arms and legs. Worse at night and when due for injections.   Continue Xolair 300mg  injections every 3 weeks.  Monitor symptoms. If no improvement then will discuss changing therapy.   Avoid the following potential triggers: alcohol, tight clothing, NSAIDs.  Continuezyrtec 20mg  twice a day.   ContinuePepcid20mg  twice a day.  Continue Singulair 10mg  daily.  May use hydroxyzine 25mg  tablet 1/2 tablet to 1 tablet 1 hour before bedtime as needed for itching.  For mild symptoms you can take over the counter antihistamines such as Benadryl and monitor symptoms closely. If symptoms worsen or if you have severe symptoms including breathing issues, throat closure, significant swelling, whole body hives, severe diarrhea  and vomiting, lightheadedness then inject epinephrine and seek immediate medical care afterwards.  Return in about 3 months (around 02/14/2020).  Diagnostics: None.  Medication List:  Current Outpatient Medications  Medication Sig Dispense Refill  . amLODipine (NORVASC) 10 MG tablet Take 1 tablet (10 mg total) by mouth daily. 90 tablet 3  . BLISOVI 24 FE 1-20 MG-MCG(24) tablet Take 1 tablet by mouth daily.     Marland Kitchen CALCIUM PO Take 1 tablet by mouth daily.    . cetirizine (ZYRTEC) 10 MG tablet Take 20 mg by mouth 2 (two) times daily.    . Cholecalciferol (VITAMIN D PO) Take 1 tablet by mouth daily.    Marland Kitchen EPINEPHrine (AUVI-Q) 0.3 mg/0.3 mL IJ SOAJ injection Inject 0.3 mLs (0.3 mg total) into the muscle as needed for anaphylaxis. 2 each 1  . famotidine (PEPCID) 20 MG tablet Take 1 tablet (20 mg total) by mouth 2 (two) times daily. 60 tablet 5  . hydrOXYzine (ATARAX/VISTARIL) 25 MG tablet Take 1/2 tablet to 1 tablet at night as needed for itching. 30 tablet 5  . montelukast (SINGULAIR) 10 MG tablet TAKE 1 TABLET BY MOUTH EVERY DAY 30 tablet 5  . Multiple Vitamin (MULTIVITAMIN WITH MINERALS) TABS tablet Take 1 tablet by mouth daily.    . Omalizumab (XOLAIR [place]) Inject into the skin. Every 4 weeks in each arm     Current Facility-Administered Medications  Medication Dose Route Frequency Provider Last Rate Last Admin  . omalizumab Arvid Right) injection 300 mg  300 mg Subcutaneous Q28 days Garnet Sierras, DO  300 mg at 11/14/19 1110   Allergies: Allergies  Allergen Reactions  . Codeine Nausea And Vomiting   I reviewed her past medical history, social history, family history, and environmental history and no significant changes have been reported from her previous visit.  Review of Systems  Constitutional: Negative for appetite change, chills, fever and unexpected weight change.  HENT: Negative for congestion and rhinorrhea.   Eyes: Negative for itching.  Respiratory: Negative for cough, chest  tightness, shortness of breath and wheezing.   Cardiovascular: Negative for chest pain.  Gastrointestinal: Negative for abdominal pain.  Genitourinary: Negative for difficulty urinating.  Skin: Positive for rash.  Allergic/Immunologic: Negative for environmental allergies and food allergies.  Neurological: Negative for headaches.   Objective: BP 132/80 (BP Location: Left Arm, Patient Position: Sitting, Cuff Size: Normal)   Pulse 85   Temp 98.2 F (36.8 C) (Temporal)   Resp 16   Wt 202 lb 9.6 oz (91.9 kg)   SpO2 95%   BMI 34.78 kg/m  Body mass index is 34.78 kg/m. Physical Exam  Constitutional: She is oriented to person, place, and time. She appears well-developed and well-nourished.  HENT:  Head: Normocephalic and atraumatic.  Right Ear: External ear normal.  Left Ear: External ear normal.  Nose: Nose normal.  Mouth/Throat: Oropharynx is clear and moist.  Eyes: Conjunctivae and EOM are normal.  Cardiovascular: Normal rate, regular rhythm and normal heart sounds. Exam reveals no gallop and no friction rub.  No murmur heard. Pulmonary/Chest: Effort normal and breath sounds normal. She has no wheezes. She has no rales.  Abdominal: Soft.  Musculoskeletal:     Cervical back: Neck supple.  Neurological: She is alert and oriented to person, place, and time.  Skin: Skin is warm. Rash noted.  Few urticarial rash on upper extremities b/l.  Psychiatric: She has a normal mood and affect. Her behavior is normal.  Nursing note and vitals reviewed.  Previous notes and tests were reviewed. The plan was reviewed with the patient/family, and all questions/concerned were addressed.  It was my pleasure to see [name] today and participate in her care. Please feel free to contact me with any questions or concerns.  Sincerely,  Rexene Alberts, DO Allergy & Immunology  [facility] office: [phone] [facility] office: [place] office:  [phone]

## 2019-11-14 ENCOUNTER — Ambulatory Visit: Payer: Self-pay

## 2019-11-14 ENCOUNTER — Encounter: Payer: Self-pay | Admitting: Allergy

## 2019-11-14 ENCOUNTER — Ambulatory Visit: Payer: 59 | Admitting: Allergy

## 2019-11-14 ENCOUNTER — Other Ambulatory Visit: Payer: Self-pay

## 2019-11-14 VITALS — BP 132/80 | HR 85 | Temp 98.2°F | Resp 16 | Wt 202.6 lb

## 2019-11-14 DIAGNOSIS — L501 Idiopathic urticaria: Secondary | ICD-10-CM | POA: Diagnosis not present

## 2019-11-14 DIAGNOSIS — L509 Urticaria, unspecified: Secondary | ICD-10-CM

## 2019-11-14 NOTE — Assessment & Plan Note (Signed)
Past history - 5 week history of urticaria, pruritus and episodes of angioedema. No triggers noted. Labwork unremarkable. 50-60% improvement with Xolair.  Interim history - still having a few daily hives on arms and legs. Worse at night and when due for injections.   Continue Xolair 300mg  injections every 3 weeks.  Monitor symptoms. If no improvement then will discuss changing therapy.   Avoid the following potential triggers: alcohol, tight clothing, NSAIDs.  Continuezyrtec 20mg  twice a day.   ContinuePepcid20mg  twice a day.  Continue Singulair 10mg  daily.  May use hydroxyzine 25mg  tablet 1/2 tablet to 1 tablet 1 hour before bedtime as needed for itching.  For mild symptoms you can take over the counter antihistamines such as Benadryl and monitor symptoms closely. If symptoms worsen or if you have severe symptoms including breathing issues, throat closure, significant swelling, whole body hives, severe diarrhea and vomiting, lightheadedness then inject epinephrine and seek immediate medical care afterwards.

## 2019-11-14 NOTE — Patient Instructions (Addendum)
Urticaria  Continue Xolair 300mg  injections every 3 weeks  Monitor symptoms.   Avoid the following potential triggers: alcohol, tight clothing, NSAIDs.  Continuezyrtec 20mg  twice a day.   ContinuePepcid20mg  twice a day.  Continue Singulair 10mg  daily.  May use hydroxyzine 25mg  tablet 1/2 tablet to 1 tablet 1 hour before bedtime as needed for itching.  For mild symptoms you can take over the counter antihistamines such as Benadryl and monitor symptoms closely. If symptoms worsen or if you have severe symptoms including breathing issues, throat closure, significant swelling, whole body hives, severe diarrhea and vomiting, lightheadedness then inject epinephrine and seek immediate medical care afterwards.  Follow up in 3 months or sooner if needed.

## 2019-11-15 ENCOUNTER — Other Ambulatory Visit: Payer: Self-pay | Admitting: *Deleted

## 2019-11-15 MED ORDER — OMALIZUMAB 150 MG ~~LOC~~ SOLR: 300.0000 mg | SUBCUTANEOUS | 11 refills | Status: DC

## 2019-11-15 MED ORDER — OMALIZUMAB 150 MG ~~LOC~~ SOLR
300.0000 mg | SUBCUTANEOUS | Status: DC
  Administered 2019-12-05 – 2021-12-13 (×29): 300 mg via SUBCUTANEOUS

## 2019-11-15 NOTE — Telephone Encounter (Signed)
PA approved for increse dose to 300mg  every 14 days. New ERX sent to [facility] and L/M for patient regarding same and will reach out when new Rx ready to ship to bring her in early

## 2019-12-05 ENCOUNTER — Ambulatory Visit (INDEPENDENT_AMBULATORY_CARE_PROVIDER_SITE_OTHER): Payer: 59

## 2019-12-05 DIAGNOSIS — L501 Idiopathic urticaria: Secondary | ICD-10-CM | POA: Diagnosis not present

## 2019-12-26 ENCOUNTER — Other Ambulatory Visit: Payer: Self-pay

## 2019-12-26 ENCOUNTER — Ambulatory Visit (INDEPENDENT_AMBULATORY_CARE_PROVIDER_SITE_OTHER): Payer: 59

## 2019-12-26 DIAGNOSIS — L501 Idiopathic urticaria: Secondary | ICD-10-CM | POA: Diagnosis not present

## 2019-12-26 DIAGNOSIS — L509 Urticaria, unspecified: Secondary | ICD-10-CM

## 2020-01-16 ENCOUNTER — Ambulatory Visit: Payer: Self-pay | Admitting: *Deleted

## 2020-01-16 ENCOUNTER — Ambulatory Visit (INDEPENDENT_AMBULATORY_CARE_PROVIDER_SITE_OTHER): Payer: 59 | Admitting: *Deleted

## 2020-01-16 ENCOUNTER — Other Ambulatory Visit: Payer: Self-pay

## 2020-01-16 DIAGNOSIS — L509 Urticaria, unspecified: Secondary | ICD-10-CM

## 2020-01-16 DIAGNOSIS — L501 Idiopathic urticaria: Secondary | ICD-10-CM | POA: Diagnosis not present

## 2020-01-29 ENCOUNTER — Other Ambulatory Visit: Payer: Self-pay | Admitting: Allergy and Immunology

## 2020-02-08 ENCOUNTER — Other Ambulatory Visit: Payer: Self-pay

## 2020-02-08 ENCOUNTER — Ambulatory Visit (INDEPENDENT_AMBULATORY_CARE_PROVIDER_SITE_OTHER): Payer: 59

## 2020-02-08 DIAGNOSIS — L509 Urticaria, unspecified: Secondary | ICD-10-CM

## 2020-02-08 DIAGNOSIS — L501 Idiopathic urticaria: Secondary | ICD-10-CM

## 2020-02-23 ENCOUNTER — Other Ambulatory Visit: Payer: Self-pay | Admitting: Allergy and Immunology

## 2020-02-26 NOTE — Progress Notes (Signed)
Follow Up Note  RE: Ashley Turner MRN: [id number] DOB: [birth year] Date of Office Visit: 02/27/2020  Referring provider: Forrest Moron, MD Primary care provider: Forrest Moron, MD  Chief Complaint: Follow-up and Urticaria  History of Present Illness: I had the pleasure of seeing Ashley Turner for a follow up visit at the [facility] on 02/27/2020. She is a 49 y.o. female, who is being followed for urticaria on Xolair 300 mg every 3 weeks injections. Her previous allergy office visit was on 11/14/2019 with Dr. Maudie Mercury. Today is a regular follow up visit.  Urticaria Currently on Xolair 300mg  injections every 3 weeks and doing well with it. No issues with the injections.  No oral prednisone since the last visit.   Patient had Covid-19 in July which flared her hives at that time. Still having issues with sense of taste and smell. Only has some nasal congestion.   Doing well with minimal hives since then. No angioedema episodes.   Currently on zyrtec 20mg  BID, Pepcid 20mg  BID, Singulair 10mg  daily. Did not notice any hive flare up if she misses a dose.   Assessment and Plan: Ashley Turner is a 49 y.o. female with: Urticaria Past history - urticaria, pruritus and episodes of angioedema. No triggers noted. Labwork unremarkable.  Interim history - doing much better. Hives flared with COVID-19 infection.   Continue Xolair 300mg  injections every 3 weeks - given today.   Monitor symptoms.  Avoid the following potential triggers: alcohol, tight clothing, NSAIDs.  Decrease zyrtec to 10mg  in the morning and 20mg  in the evening. Continue Pepcid 20mg  twice a day and Singulair 10mg  daily. If no hives/itching for 2 weeks then:  Decrease zyrtec to 10mg  twice a day. Continue Pepcid 20mg  twice a day and Singulair 10mg  daily. If no hives/itching for 2 weeks then:  Stop Singulair. Continue with zyrtec 10mg  twice a day. Continue Pepcid 20mg  twice a day. If no hives/itching for 2 weeks  then:  Decrease Pepcid to 20mg  once a day. Continue with zyrtec 10mg  twice a day. If no hives/itching for 2 weeks then:  Decrease zyrtec to 10mg  once a day. Continue Pepcid 20mg  once a day.  If you get hives then go back to the dose where you didn't have any symptoms.   May use hydroxyzine 25mg  tablet 1/2 tablet to 1 tablet 1 hour before bedtime as needed for itching.  For mild symptoms you can take over the counter antihistamines such as Benadryl and monitor symptoms closely. If symptoms worsen or if you have severe symptoms including breathing issues, throat closure, significant swelling, whole body hives, severe diarrhea and vomiting, lightheadedness then inject epinephrine and seek immediate medical care afterwards.  Return in about 4 months (around 06/28/2020).  Diagnostics: None.  Medication List:  Current Outpatient Medications  Medication Sig Dispense Refill   cetirizine (ZYRTEC) 10 MG tablet Take 20 mg by mouth 2 (two) times daily.     EPINEPHrine (AUVI-Q) 0.3 mg/0.3 mL IJ SOAJ injection Inject 0.3 mLs (0.3 mg total) into the muscle as needed for anaphylaxis. 2 each 1   famotidine (PEPCID) 20 MG tablet Take 1 tablet (20 mg total) by mouth 2 (two) times daily. 60 tablet 5   hydrOXYzine (ATARAX/VISTARIL) 25 MG tablet Take 1/2 tablet to 1 tablet at night as needed for itching. 30 tablet 5   montelukast (SINGULAIR) 10 MG tablet TAKE 1 TABLET BY MOUTH EVERY DAY 30 tablet 2   omalizumab (XOLAIR) 150 MG injection  Inject 300 mg into the skin every 14 (fourteen) days. 4 each 11   amLODipine (NORVASC) 10 MG tablet Take 1 tablet (10 mg total) by mouth daily. 90 tablet 3   BLISOVI 24 FE 1-20 MG-MCG(24) tablet Take 1 tablet by mouth daily.      CALCIUM PO Take 1 tablet by mouth daily.     Cholecalciferol (VITAMIN D PO) Take 1 tablet by mouth daily.     Multiple Vitamin (MULTIVITAMIN WITH MINERALS) TABS tablet Take 1 tablet by mouth daily.     Current Facility-Administered  Medications  Medication Dose Route Frequency Provider Last Rate Last Admin   omalizumab Arvid Right) injection 300 mg  300 mg Subcutaneous Q14 Days Garnet Sierras, DO   300 mg at 02/27/20 1036   Allergies: Allergies  Allergen Reactions   Codeine Nausea And Vomiting   I reviewed her past medical history, social history, family history, and environmental history and no significant changes have been reported from her previous visit.  Review of Systems  Constitutional: Negative for appetite change, chills, fever and unexpected weight change.  HENT: Negative for congestion and rhinorrhea.   Eyes: Negative for itching.  Respiratory: Negative for cough, chest tightness, shortness of breath and wheezing.   Cardiovascular: Negative for chest pain.  Gastrointestinal: Negative for abdominal pain.  Genitourinary: Negative for difficulty urinating.  Skin: Positive for rash.  Allergic/Immunologic: Negative for environmental allergies and food allergies.  Neurological: Negative for headaches.   Objective: BP 112/80    Pulse 73    Temp 98.6 F (37 C) (Temporal)    Resp 16    Wt 198 lb 9.6 oz (90.1 kg)    SpO2 98%    BMI 34.09 kg/m  Body mass index is 34.09 kg/m. Physical Exam Vitals and nursing note reviewed.  Constitutional:      Appearance: Normal appearance. She is well-developed.  HENT:     Head: Normocephalic and atraumatic.     Right Ear: External ear normal.     Left Ear: External ear normal.     Ears:     Comments: Scarring in TM b/l.    Nose: Nose normal.     Mouth/Throat:     Mouth: Mucous membranes are moist.     Pharynx: Oropharynx is clear.  Eyes:     Conjunctiva/sclera: Conjunctivae normal.  Cardiovascular:     Rate and Rhythm: Normal rate and regular rhythm.     Heart sounds: Normal heart sounds. No murmur heard.  No friction rub. No gallop.   Pulmonary:     Effort: Pulmonary effort is normal.     Breath sounds: Normal breath sounds. No wheezing or rales.   Musculoskeletal:     Cervical back: Neck supple.  Skin:    General: Skin is warm.     Findings: No rash.  Neurological:     Mental Status: She is alert and oriented to person, place, and time.  Psychiatric:        Behavior: Behavior normal.    Previous notes and tests were reviewed. The plan was reviewed with the patient/family, and all questions/concerned were addressed.  It was my pleasure to see Ashley Turner today and participate in her care. Please feel free to contact me with any questions or concerns.  Sincerely,  Rexene Alberts, DO Allergy & Immunology  [facility] office: [phone] [facility] office: [place] office: [phone]

## 2020-02-27 ENCOUNTER — Other Ambulatory Visit: Payer: Self-pay

## 2020-02-27 ENCOUNTER — Ambulatory Visit: Payer: 59 | Admitting: Allergy

## 2020-02-27 ENCOUNTER — Encounter: Payer: Self-pay | Admitting: Allergy

## 2020-02-27 ENCOUNTER — Ambulatory Visit: Payer: Self-pay

## 2020-02-27 VITALS — BP 112/80 | HR 73 | Temp 98.6°F | Resp 16 | Wt 198.6 lb

## 2020-02-27 DIAGNOSIS — L501 Idiopathic urticaria: Secondary | ICD-10-CM | POA: Diagnosis not present

## 2020-02-27 DIAGNOSIS — L509 Urticaria, unspecified: Secondary | ICD-10-CM

## 2020-02-27 NOTE — Patient Instructions (Addendum)
Urticaria  Continue Xolair 300mg  injections every 3 weeks  Monitor symptoms.  Avoid the following potential triggers: alcohol, tight clothing, NSAIDs.    Decrease zyrtec to 10mg  in the morning and 20mg  in the evening. Continue Pepcid 20mg  twice a day and Singulair 10mg  daily. If no hives/itching for 2 weeks then:  Decrease zyrtec to 10mg  twice a day. Continue Pepcid 20mg  twice a day and Singulair 10mg  daily. If no hives/itching for 2 weeks then:  Stop Singulair. Continue with zyrtec to 10mg  twice a day. Continue Pepcid 20mg  twice a day. If no hives/itching for 2 weeks then:  Decrease Pepcid 20mg  once a day. Continue with zyrtec 10mg  twice a day. If no hives/itching for 2 weeks then:  Decrease zyrtec to 10mg  once a day. And continue Pepcid 20mg  once a day.   If you get hives then go back to the dose where you didn't have any symptoms.    May use hydroxyzine 25mg  tablet 1/2 tablet to 1 tablet 1 hour before bedtime as needed for itching.  For mild symptoms you can take over the counter antihistamines such as Benadryl and monitor symptoms closely. If symptoms worsen or if you have severe symptoms including breathing issues, throat closure, significant swelling, whole body hives, severe diarrhea and vomiting, lightheadedness then inject epinephrine and seek immediate medical care afterwards.  Follow up in 4 months or sooner if needed.   Recommend getting the COVID-19 vaccine.  Here's more information: [email or web address]

## 2020-02-27 NOTE — Assessment & Plan Note (Addendum)
Past history - urticaria, pruritus and episodes of angioedema. No triggers noted. Labwork unremarkable.  Interim history - doing much better. Hives flared with COVID-19 infection.   Continue Xolair 300mg  injections every 3 weeks - given today.   Monitor symptoms.  Avoid the following potential triggers: alcohol, tight clothing, NSAIDs.  Decrease zyrtec to 10mg  in the morning and 20mg  in the evening. Continue Pepcid 20mg  twice a day and Singulair 10mg  daily. If no hives/itching for 2 weeks then:  Decrease zyrtec to 10mg  twice a day. Continue Pepcid 20mg  twice a day and Singulair 10mg  daily. If no hives/itching for 2 weeks then:  Stop Singulair. Continue with zyrtec 10mg  twice a day. Continue Pepcid 20mg  twice a day. If no hives/itching for 2 weeks then:  Decrease Pepcid to 20mg  once a day. Continue with zyrtec 10mg  twice a day. If no hives/itching for 2 weeks then:  Decrease zyrtec to 10mg  once a day. Continue Pepcid 20mg  once a day.  If you get hives then go back to the dose where you didn't have any symptoms.   May use hydroxyzine 25mg  tablet 1/2 tablet to 1 tablet 1 hour before bedtime as needed for itching.  For mild symptoms you can take over the counter antihistamines such as Benadryl and monitor symptoms closely. If symptoms worsen or if you have severe symptoms including breathing issues, throat closure, significant swelling, whole body hives, severe diarrhea and vomiting, lightheadedness then inject epinephrine and seek immediate medical care afterwards.

## 2020-03-19 ENCOUNTER — Ambulatory Visit (INDEPENDENT_AMBULATORY_CARE_PROVIDER_SITE_OTHER): Payer: 59

## 2020-03-19 ENCOUNTER — Other Ambulatory Visit: Payer: Self-pay

## 2020-03-19 DIAGNOSIS — L501 Idiopathic urticaria: Secondary | ICD-10-CM | POA: Diagnosis not present

## 2020-03-19 DIAGNOSIS — L509 Urticaria, unspecified: Secondary | ICD-10-CM

## 2020-04-09 ENCOUNTER — Ambulatory Visit (INDEPENDENT_AMBULATORY_CARE_PROVIDER_SITE_OTHER): Payer: 59

## 2020-04-09 ENCOUNTER — Other Ambulatory Visit: Payer: Self-pay

## 2020-04-09 DIAGNOSIS — L509 Urticaria, unspecified: Secondary | ICD-10-CM

## 2020-04-30 ENCOUNTER — Other Ambulatory Visit: Payer: Self-pay

## 2020-04-30 ENCOUNTER — Ambulatory Visit (INDEPENDENT_AMBULATORY_CARE_PROVIDER_SITE_OTHER): Payer: 59

## 2020-04-30 DIAGNOSIS — L501 Idiopathic urticaria: Secondary | ICD-10-CM | POA: Diagnosis not present

## 2020-04-30 DIAGNOSIS — L509 Urticaria, unspecified: Secondary | ICD-10-CM

## 2020-05-21 ENCOUNTER — Ambulatory Visit (INDEPENDENT_AMBULATORY_CARE_PROVIDER_SITE_OTHER): Payer: 59 | Admitting: *Deleted

## 2020-05-21 ENCOUNTER — Other Ambulatory Visit: Payer: Self-pay

## 2020-05-21 DIAGNOSIS — L501 Idiopathic urticaria: Secondary | ICD-10-CM | POA: Diagnosis not present

## 2020-05-21 DIAGNOSIS — L509 Urticaria, unspecified: Secondary | ICD-10-CM

## 2020-05-23 ENCOUNTER — Other Ambulatory Visit: Payer: Self-pay | Admitting: Allergy and Immunology

## 2020-05-25 ENCOUNTER — Ambulatory Visit: Payer: 59 | Admitting: Registered Nurse

## 2020-05-25 ENCOUNTER — Other Ambulatory Visit: Payer: Self-pay

## 2020-05-25 ENCOUNTER — Encounter: Payer: Self-pay | Admitting: Registered Nurse

## 2020-05-25 DIAGNOSIS — I10 Essential (primary) hypertension: Secondary | ICD-10-CM

## 2020-05-25 MED ORDER — AMLODIPINE BESYLATE 10 MG PO TABS: 10.0000 mg | ORAL_TABLET | Freq: Every day | ORAL | 3 refills | Status: DC

## 2020-05-25 NOTE — Patient Instructions (Signed)
° ° ° °  If you have lab work done today you will be contacted with your lab results within the next 2 weeks.  If you have not heard from us then please contact us. The fastest way to get your results is to register for My Chart. ° ° °IF you received an x-ray today, you will receive an invoice from [facility]. Please contact [facility] at [phone] with questions or concerns regarding your invoice.  ° °IF you received labwork today, you will receive an invoice from [facility]. Please contact [facility] at [phone] with questions or concerns regarding your invoice.  ° °Our billing staff will not be able to assist you with questions regarding bills from these companies. ° °You will be contacted with the lab results as soon as they are available. The fastest way to get your results is to activate your My Chart account. Instructions are located on the last page of this paperwork. If you have not heard from us regarding the results in 2 weeks, please contact this office. °  ° ° ° °

## 2020-05-25 NOTE — Progress Notes (Signed)
Established Patient Office Visit  Subjective:  Patient ID: Ashley Turner, female    DOB: 02/13/1971  Age: 49 y.o. MRN: [id number]  CC:  Chief Complaint  Patient presents with  . Medication Refill    Patient states she is here for medication refill for amlodipine. Per patient she has no other concerns.     HPI Ashley Turner presents for med refill  Hypertension: Patient Currently taking: amlodipine 10mg  PO qd Good effect. No AEs. Denies CV symptoms including: chest pain, shob, doe, headache, visual changes, fatigue, claudication, and dependent edema.   Previous readings and labs: BP Readings from Last 3 Encounters:  05/25/20 126/78  02/27/20 112/80  11/14/19 132/80   Lab Results  Component Value Date   CREATININE 0.87 03/18/2019      Past Medical History:  Diagnosis Date  . Angio-edema   . Heart murmur   . HTN (hypertension)   . Urticaria     Past Surgical History:  Procedure Laterality Date  . Tubes in ears Bilateral   . TYMPANOSTOMY TUBE PLACEMENT      Family History  Problem Relation Age of Onset  . Hyperlipidemia Mother   . Hypertension Mother   . Cancer Father   . Hypertension Father   . Stroke Father   . Hypertension Sister     Social History   Socioeconomic History  . Marital status: Single    Spouse name: Not on file  . Number of children: 1  . Years of education: Not on file  . Highest education level: Some college, no degree  Occupational History  . Occupation: inside Scientist, clinical (histocompatibility and immunogenetics): [employer]  Tobacco Use  . Smoking status: Never Smoker  . Smokeless tobacco: Never Used  Vaping Use  . Vaping Use: Never used  Substance and Sexual Activity  . Alcohol use: Yes    Alcohol/week: 2.0 standard drinks    Types: 2 Cans of beer per week    Comment: rare weekends  . Drug use: No  . Sexual activity: Not on file  Other Topics Concern  . Not on file  Social History Narrative   Lives with her daughter.   Mother and  sister live in [place].   Social Determinants of Health   Financial Resource Strain: Not on file  Food Insecurity: Not on file  Transportation Needs: Not on file  Physical Activity: Not on file  Stress: Not on file  Social Connections: Not on file  Intimate Partner Violence: Not on file    Outpatient Medications Prior to Visit  Medication Sig Dispense Refill  . BLISOVI 24 FE 1-20 MG-MCG(24) tablet Take 1 tablet by mouth daily.     Marland Kitchen CALCIUM PO Take 1 tablet by mouth daily.    . cetirizine (ZYRTEC) 10 MG tablet Take 20 mg by mouth 2 (two) times daily.    . Cholecalciferol (VITAMIN D PO) Take 1 tablet by mouth daily.    Marland Kitchen EPINEPHrine (AUVI-Q) 0.3 mg/0.3 mL IJ SOAJ injection Inject 0.3 mLs (0.3 mg total) into the muscle as needed for anaphylaxis. 2 each 1  . famotidine (PEPCID) 20 MG tablet Take 1 tablet (20 mg total) by mouth 2 (two) times daily. 60 tablet 5  . hydrOXYzine (ATARAX/VISTARIL) 25 MG tablet Take 1/2 tablet to 1 tablet at night as needed for itching. 30 tablet 5  . montelukast (SINGULAIR) 10 MG tablet TAKE 1 TABLET BY MOUTH EVERY DAY 30 tablet 2  . Multiple  Vitamin (MULTIVITAMIN WITH MINERALS) TABS tablet Take 1 tablet by mouth daily.    Marland Kitchen omalizumab (XOLAIR) 150 MG injection Inject 300 mg into the skin every 14 (fourteen) days. 4 each 11  . amLODipine (NORVASC) 10 MG tablet Take 1 tablet (10 mg total) by mouth daily. 90 tablet 3   Facility-Administered Medications Prior to Visit  Medication Dose Route Frequency Provider Last Rate Last Admin  . omalizumab Arvid Right) injection 300 mg  300 mg Subcutaneous Q14 Days Garnet Sierras, DO   300 mg at 05/21/20 8469    Allergies  Allergen Reactions  . Codeine Nausea And Vomiting    ROS Review of Systems  Constitutional: Negative.   HENT: Negative.   Eyes: Negative.   Respiratory: Negative.   Cardiovascular: Negative.   Gastrointestinal: Negative.   Genitourinary: Negative.   Musculoskeletal: Negative.   Skin: Negative.    Neurological: Negative.   Psychiatric/Behavioral: Negative.   All other systems reviewed and are negative.     Objective:    Physical Exam Vitals and nursing note reviewed.  Constitutional:      General: She is not in acute distress.    Appearance: Normal appearance. She is normal weight. She is not ill-appearing, toxic-appearing or diaphoretic.  Cardiovascular:     Rate and Rhythm: Normal rate and regular rhythm.     Heart sounds: Normal heart sounds. No murmur heard. No friction rub. No gallop.   Pulmonary:     Effort: Pulmonary effort is normal. No respiratory distress.     Breath sounds: Normal breath sounds. No stridor. No wheezing, rhonchi or rales.  Chest:     Chest wall: No tenderness.  Skin:    General: Skin is warm and dry.  Neurological:     General: No focal deficit present.     Mental Status: She is alert and oriented to person, place, and time. Mental status is at baseline.  Psychiatric:        Mood and Affect: Mood normal.        Behavior: Behavior normal.        Thought Content: Thought content normal.        Judgment: Judgment normal.     BP 126/78 (BP Location: Right Arm, Patient Position: Sitting, Cuff Size: Large)   Pulse 86   Temp 97.9 F (36.6 C) (Temporal)   Resp 18   Ht 5\' 4"  (1.626 m)   Wt 201 lb 6.4 oz (91.4 kg)   SpO2 97%   BMI 34.57 kg/m  Wt Readings from Last 3 Encounters:  05/25/20 201 lb 6.4 oz (91.4 kg)  02/27/20 198 lb 9.6 oz (90.1 kg)  11/14/19 202 lb 9.6 oz (91.9 kg)     There are no preventive care reminders to display for this patient.  There are no preventive care reminders to display for this patient.  Lab Results  Component Value Date   TSH 2.010 05/10/2018   Lab Results  Component Value Date   WBC 17.1 (H) 05/10/2018   HGB 13.7 05/10/2018   HCT 41.2 05/10/2018   MCV 85 05/10/2018   PLT 349 05/10/2018   Lab Results  Component Value Date   NA 142 03/18/2019   K 4.2 03/18/2019   CO2 20 03/18/2019    GLUCOSE 85 03/18/2019   BUN 13 03/18/2019   CREATININE 0.87 03/18/2019   BILITOT 0.3 03/18/2019   ALKPHOS 40 03/18/2019   AST 12 03/18/2019   ALT 12 03/18/2019   PROT 6.6 03/18/2019  ALBUMIN 4.2 03/18/2019   CALCIUM 9.9 03/18/2019   Lab Results  Component Value Date   CHOL 198 08/28/2017   Lab Results  Component Value Date   HDL 63 08/28/2017   Lab Results  Component Value Date   LDLCALC 120 (H) 08/28/2017   Lab Results  Component Value Date   TRIG 76 08/28/2017   Lab Results  Component Value Date   CHOLHDL 3.1 08/28/2017   Lab Results  Component Value Date   HGBA1C 5.2 11/07/2016      Assessment & Plan:   Problem List Items Addressed This Visit      Cardiovascular and Mediastinum   Essential hypertension   Relevant Medications   amLODipine (NORVASC) 10 MG tablet      Meds ordered this encounter  Medications  . amLODipine (NORVASC) 10 MG tablet    Sig: Take 1 tablet (10 mg total) by mouth daily.    Dispense:  90 tablet    Refill:  3    Order Specific Question:   Supervising Provider    Answer:   Carlota Raspberry, JEFFREY R [[id number]]    Follow-up: No follow-ups on file.   PLAN  Refill x 1 year  Pt declines labs today - discussed risks of this  Return to est with new provider in early 2022  Patient encouraged to call clinic with any questions, comments, or concerns.  Maximiano Coss, NP

## 2020-06-11 ENCOUNTER — Ambulatory Visit (INDEPENDENT_AMBULATORY_CARE_PROVIDER_SITE_OTHER): Payer: 59 | Admitting: *Deleted

## 2020-06-11 ENCOUNTER — Other Ambulatory Visit: Payer: Self-pay

## 2020-06-11 DIAGNOSIS — L501 Idiopathic urticaria: Secondary | ICD-10-CM | POA: Diagnosis not present

## 2020-06-11 DIAGNOSIS — L509 Urticaria, unspecified: Secondary | ICD-10-CM

## 2020-07-02 ENCOUNTER — Ambulatory Visit: Payer: Self-pay

## 2020-07-02 ENCOUNTER — Other Ambulatory Visit: Payer: Self-pay

## 2020-07-02 ENCOUNTER — Ambulatory Visit (INDEPENDENT_AMBULATORY_CARE_PROVIDER_SITE_OTHER): Payer: 59 | Admitting: Allergy

## 2020-07-02 ENCOUNTER — Encounter: Payer: Self-pay | Admitting: Allergy

## 2020-07-02 VITALS — BP 120/70 | HR 79 | Temp 97.9°F | Resp 18

## 2020-07-02 DIAGNOSIS — L509 Urticaria, unspecified: Secondary | ICD-10-CM

## 2020-07-02 DIAGNOSIS — L501 Idiopathic urticaria: Secondary | ICD-10-CM | POA: Diagnosis not present

## 2020-07-02 MED ORDER — EPINEPHRINE 0.3 MG/0.3ML IJ SOAJ: 0.3000 mg | INTRAMUSCULAR | 2 refills | Status: DC | PRN

## 2020-07-02 NOTE — Assessment & Plan Note (Signed)
Past history - urticaria, pruritus and episodes of angioedema. No triggers noted. Labwork unremarkable.  Interim history - stable. Weaned down to zyrtec 10mg  daily x 1 week and then hives flared in November.   Continue Xolair 300mg  injections every 3 weeks- given today.   Monitor symptoms.  Avoid the following potential triggers: alcohol, tight clothing, NSAIDs.  Continue zyrtec 10mg  twice a day. Continue Pepcid 20mg  once a day and Singulair 10mg  daily. If no hives/itching for 4 weeks then:  Stop Singulair. Continue with zyrtec 10mg  twice a day. Continue Pepcid 20mg  once a day. If no hives/itching for 4 weeks then:  Stop Pepcid 20mg . Continue with zyrtec 10mg  twice a day.   If you get hives then go back to the dose where you didn't have any symptoms.   May use hydroxyzine 25mg  tablet 1/2 tablet to 1 tablet 1 hour before bedtime as needed for itching.  For mild symptoms you can take over the counter antihistamines such as Benadryl and monitor symptoms closely. If symptoms worsen or if you have severe symptoms including breathing issues, throat closure, significant swelling, whole body hives, severe diarrhea and vomiting, lightheadedness then inject epinephrine and seek immediate medical care afterwards.

## 2020-07-02 NOTE — Progress Notes (Signed)
Follow Up Note  RE: Ashley Turner MRN: [id number] DOB: 10/06/1970 Date of Office Visit: 07/02/2020  Referring provider: Forrest Moron, MD Primary care provider: Patient, No Pcp Per  Chief Complaint: Urticaria  History of Present Illness: I had the pleasure of seeing Ashley Turner for a follow up visit at the [facility] on 07/02/2020. She is a 50 y.o. female, who is being followed for urticaria on Xolair 300 mg every 3 weeks. Her previous allergy office visit was on 02/27/2020 with Dr. Maudie Mercury. Today is a regular follow up visit.  Urticaria Currently on Xolair 300mg  every 3 weeks and this regimen seems to control her symptoms.  Patient was able to wean down to zyrtec 10mg  once a day but then the hives flared back in late November. No angioedema.  Currently on zyrtec 10mg  BID, famotidine 20mg  QHS and Singulair 10mg  QHS with no hives.  No hydroxyzine use.   Needs refill on Auvi-Q.  Did not get Covid-19 vaccine.   Assessment and Plan: Ashley Turner is a 50 y.o. female with: Urticaria Past history - urticaria, pruritus and episodes of angioedema. No triggers noted. Labwork unremarkable.  Interim history - stable. Weaned down to zyrtec 10mg  daily x 1 week and then hives flared in November.   Continue Xolair 300mg  injections every 3 weeks- given today.   Monitor symptoms.  Avoid the following potential triggers: alcohol, tight clothing, NSAIDs.  Continue zyrtec 10mg  twice a day. Continue Pepcid 20mg  once a day and Singulair 10mg  daily. If no hives/itching for 4 weeks then:  Stop Singulair. Continue with zyrtec 10mg  twice a day. Continue Pepcid 20mg  once a day. If no hives/itching for 4 weeks then:  Stop Pepcid 20mg . Continue with zyrtec 10mg  twice a day.   If you get hives then go back to the dose where you didn't have any symptoms.   May use hydroxyzine 25mg  tablet 1/2 tablet to 1 tablet 1 hour before bedtime as needed for itching.  For mild symptoms you can take  over the counter antihistamines such as Benadryl and monitor symptoms closely. If symptoms worsen or if you have severe symptoms including breathing issues, throat closure, significant swelling, whole body hives, severe diarrhea and vomiting, lightheadedness then inject epinephrine and seek immediate medical care afterwards.  Return in about 5 months (around 11/30/2020).  Meds ordered this encounter  Medications  . EPINEPHrine (AUVI-Q) 0.3 mg/0.3 mL IJ SOAJ injection    Sig: Inject 0.3 mg into the muscle as needed for anaphylaxis.    Dispense:  1 each    Refill:  2    [phone] (M)   Diagnostics: None.  Medication List:  Current Outpatient Medications  Medication Sig Dispense Refill  . amLODipine (NORVASC) 10 MG tablet Take 1 tablet (10 mg total) by mouth daily. 90 tablet 3  . BLISOVI 24 FE 1-20 MG-MCG(24) tablet Take 1 tablet by mouth daily.     Marland Kitchen CALCIUM PO Take 1 tablet by mouth daily.    . cetirizine (ZYRTEC) 10 MG tablet Take 20 mg by mouth 2 (two) times daily.    . Cholecalciferol (VITAMIN D PO) Take 1 tablet by mouth daily.    . famotidine (PEPCID) 20 MG tablet Take 1 tablet (20 mg total) by mouth 2 (two) times daily. 60 tablet 5  . hydrOXYzine (ATARAX/VISTARIL) 25 MG tablet Take 1/2 tablet to 1 tablet at night as needed for itching. 30 tablet 5  . montelukast (SINGULAIR) 10 MG tablet TAKE 1  TABLET BY MOUTH EVERY DAY 30 tablet 2  . Multiple Vitamin (MULTIVITAMIN WITH MINERALS) TABS tablet Take 1 tablet by mouth daily.    Marland Kitchen omalizumab (XOLAIR) 150 MG injection Inject 300 mg into the skin every 14 (fourteen) days. 4 each 11  . EPINEPHrine (AUVI-Q) 0.3 mg/0.3 mL IJ SOAJ injection Inject 0.3 mg into the muscle as needed for anaphylaxis. 1 each 2   Current Facility-Administered Medications  Medication Dose Route Frequency Provider Last Rate Last Admin  . omalizumab Arvid Right) injection 300 mg  300 mg Subcutaneous Q14 Days Garnet Sierras, DO   300 mg at 07/02/20 1032    Allergies: Allergies  Allergen Reactions  . Codeine Nausea And Vomiting   I reviewed her past medical history, social history, family history, and environmental history and no significant changes have been reported from her previous visit.  Review of Systems  Constitutional: Negative for appetite change, chills, fever and unexpected weight change.  HENT: Negative for congestion and rhinorrhea.   Eyes: Negative for itching.  Respiratory: Negative for cough, chest tightness, shortness of breath and wheezing.   Cardiovascular: Negative for chest pain.  Gastrointestinal: Negative for abdominal pain.  Genitourinary: Negative for difficulty urinating.  Skin: Positive for rash.  Allergic/Immunologic: Negative for environmental allergies and food allergies.  Neurological: Negative for headaches.   Objective: BP 120/70 (BP Location: Left Arm, Patient Position: Sitting, Cuff Size: Normal)   Pulse 79   Temp 97.9 F (36.6 C) (Temporal)   Resp 18   SpO2 96%  There is no height or weight on file to calculate BMI. Physical Exam Vitals and nursing note reviewed.  Constitutional:      Appearance: Normal appearance. She is well-developed.  HENT:     Head: Normocephalic and atraumatic.     Right Ear: External ear normal.     Left Ear: External ear normal.     Ears:     Comments: Scarring in TM b/l.    Nose: Nose normal.     Mouth/Throat:     Mouth: Mucous membranes are moist.     Pharynx: Oropharynx is clear.  Eyes:     Conjunctiva/sclera: Conjunctivae normal.  Cardiovascular:     Rate and Rhythm: Normal rate and regular rhythm.     Heart sounds: Normal heart sounds. No murmur heard. No friction rub. No gallop.   Pulmonary:     Effort: Pulmonary effort is normal.     Breath sounds: Normal breath sounds. No wheezing or rales.  Musculoskeletal:     Cervical back: Neck supple.  Skin:    General: Skin is warm.     Findings: No rash.  Neurological:     Mental Status: She is alert  and oriented to person, place, and time.  Psychiatric:        Behavior: Behavior normal.    Previous notes and tests were reviewed. The plan was reviewed with the patient/family, and all questions/concerned were addressed.  It was my pleasure to see Genevive today and participate in her care. Please feel free to contact me with any questions or concerns.  Sincerely,  Rexene Alberts, DO Allergy & Immunology  [facility] office: [place] office: [phone]

## 2020-07-02 NOTE — Patient Instructions (Addendum)
Urticaria  Continue Xolair 300mg  injections every 3 weeks  Monitor symptoms.  Avoid the following potential triggers: alcohol, tight clothing, NSAIDs.   Continue zyrtec 10mg  twice a day. Continue Pepcid 20mg  once a day and Singulair 10mg  daily. If no hives/itching for 4 weeks then:  Stop Singulair. Continue with zyrtec 10mg  twice a day. Continue Pepcid 20mg  once a day. If no hives/itching for 4 weeks then:  Stop Pepcid 20mg . Continue with zyrtec 10mg  twice a day.    If you get hives then go back to the dose where you didn't have any symptoms.    May use hydroxyzine 25mg  tablet 1/2 tablet to 1 tablet 1 hour before bedtime as needed for itching.  For mild symptoms you can take over the counter antihistamines such as Benadryl and monitor symptoms closely. If symptoms worsen or if you have severe symptoms including breathing issues, throat closure, significant swelling, whole body hives, severe diarrhea and vomiting, lightheadedness then inject epinephrine and seek immediate medical care afterwards.  Follow up in 5 months or sooner if needed.   Recommend getting the COVID-19 vaccine.  Here's more information: [email or web address]

## 2020-07-23 ENCOUNTER — Ambulatory Visit (INDEPENDENT_AMBULATORY_CARE_PROVIDER_SITE_OTHER): Payer: 59 | Admitting: *Deleted

## 2020-07-23 ENCOUNTER — Other Ambulatory Visit: Payer: Self-pay

## 2020-07-23 DIAGNOSIS — L509 Urticaria, unspecified: Secondary | ICD-10-CM | POA: Diagnosis not present

## 2020-07-23 LAB — HM MAMMOGRAPHY

## 2020-08-13 ENCOUNTER — Ambulatory Visit (INDEPENDENT_AMBULATORY_CARE_PROVIDER_SITE_OTHER): Payer: 59 | Admitting: *Deleted

## 2020-08-13 ENCOUNTER — Other Ambulatory Visit: Payer: Self-pay

## 2020-08-13 DIAGNOSIS — L501 Idiopathic urticaria: Secondary | ICD-10-CM

## 2020-08-13 DIAGNOSIS — L509 Urticaria, unspecified: Secondary | ICD-10-CM

## 2020-09-03 ENCOUNTER — Other Ambulatory Visit: Payer: Self-pay

## 2020-09-03 ENCOUNTER — Ambulatory Visit (INDEPENDENT_AMBULATORY_CARE_PROVIDER_SITE_OTHER): Payer: 59

## 2020-09-03 DIAGNOSIS — L509 Urticaria, unspecified: Secondary | ICD-10-CM

## 2020-09-03 DIAGNOSIS — L501 Idiopathic urticaria: Secondary | ICD-10-CM | POA: Diagnosis not present

## 2020-09-04 IMAGING — MG DIGITAL DIAGNOSTIC UNILATERAL LEFT MAMMOGRAM WITH TOMO AND CAD
4 series · 4 of 12 positions shown · non-contrast
Comparison: Previous exam(s).

CLINICAL DATA: 47-year-old female recalled from screening mammogram
dated 07/12/2018 for a possible left breast mass.

EXAM:
DIGITAL DIAGNOSTIC LEFT MAMMOGRAM WITH CAD AND TOMO
ULTRASOUND LEFT BREAST

[L CC synth-2D]
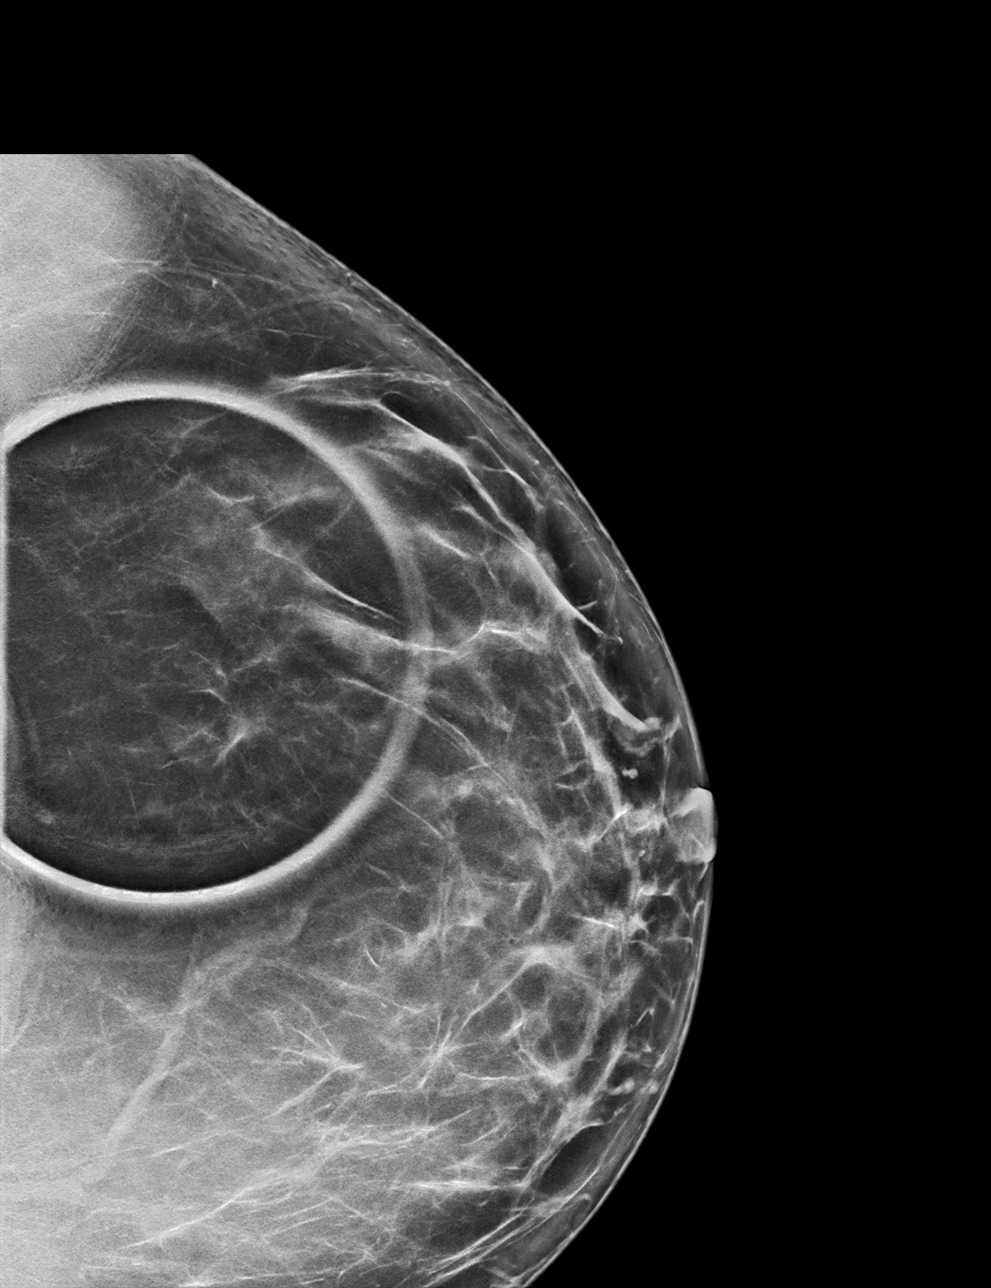

[L MLO synth-2D]
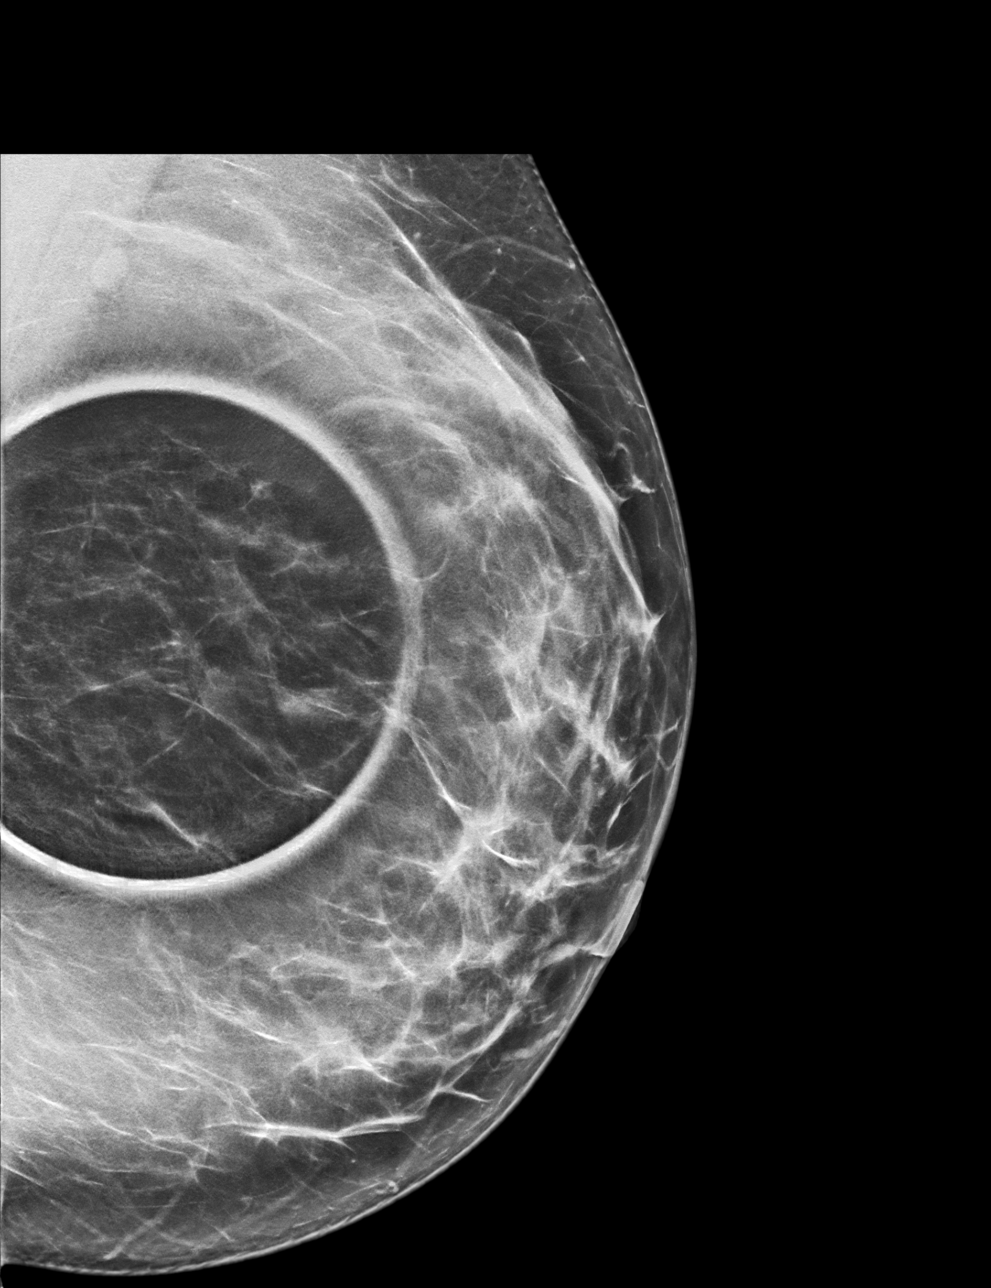

[L CC tomo · tomo slice 41/82.0]
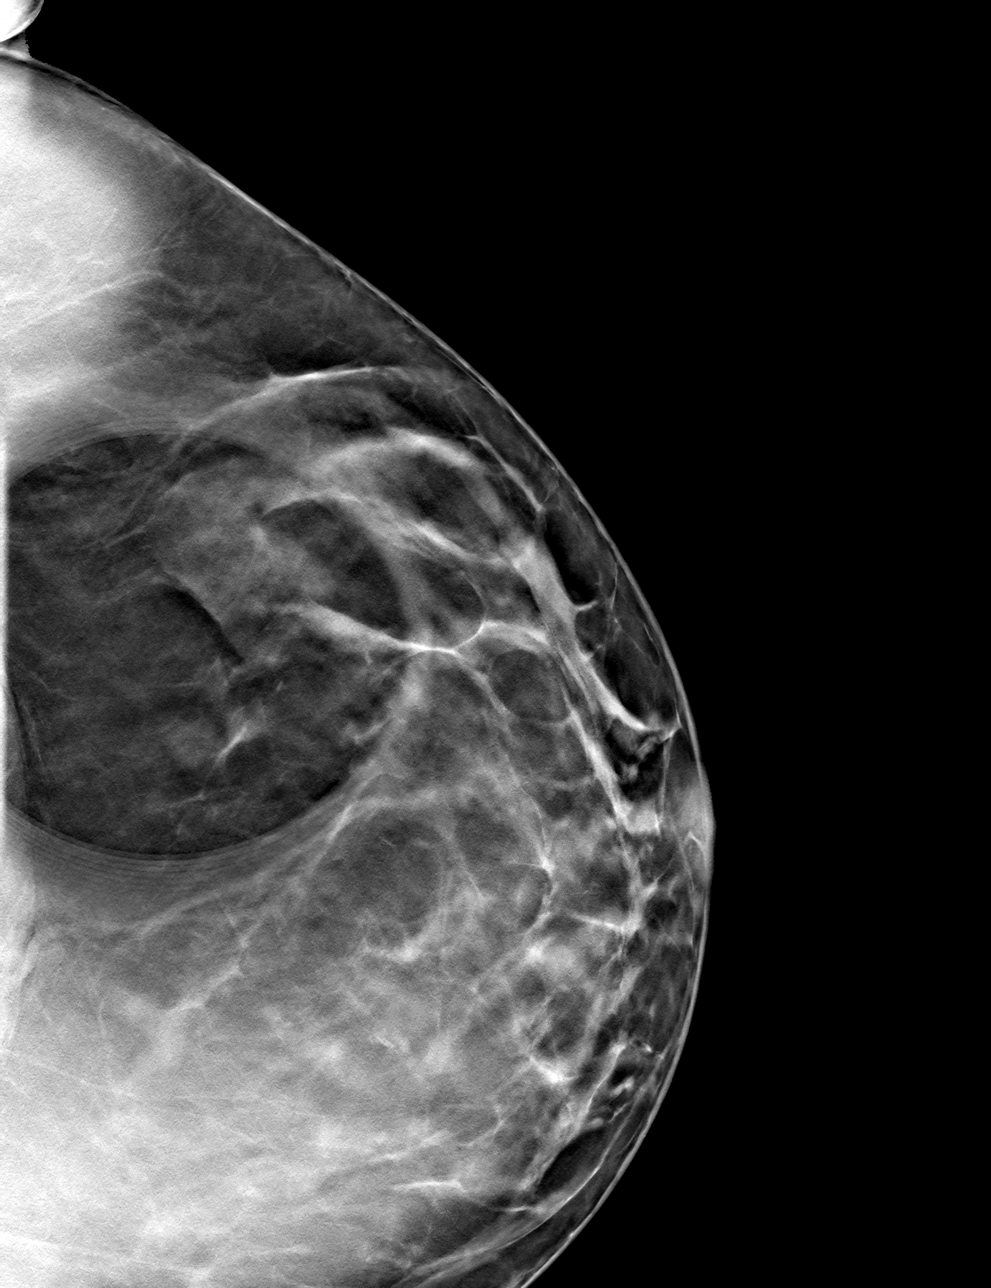

[L MLO tomo · tomo slice 38/75.0]
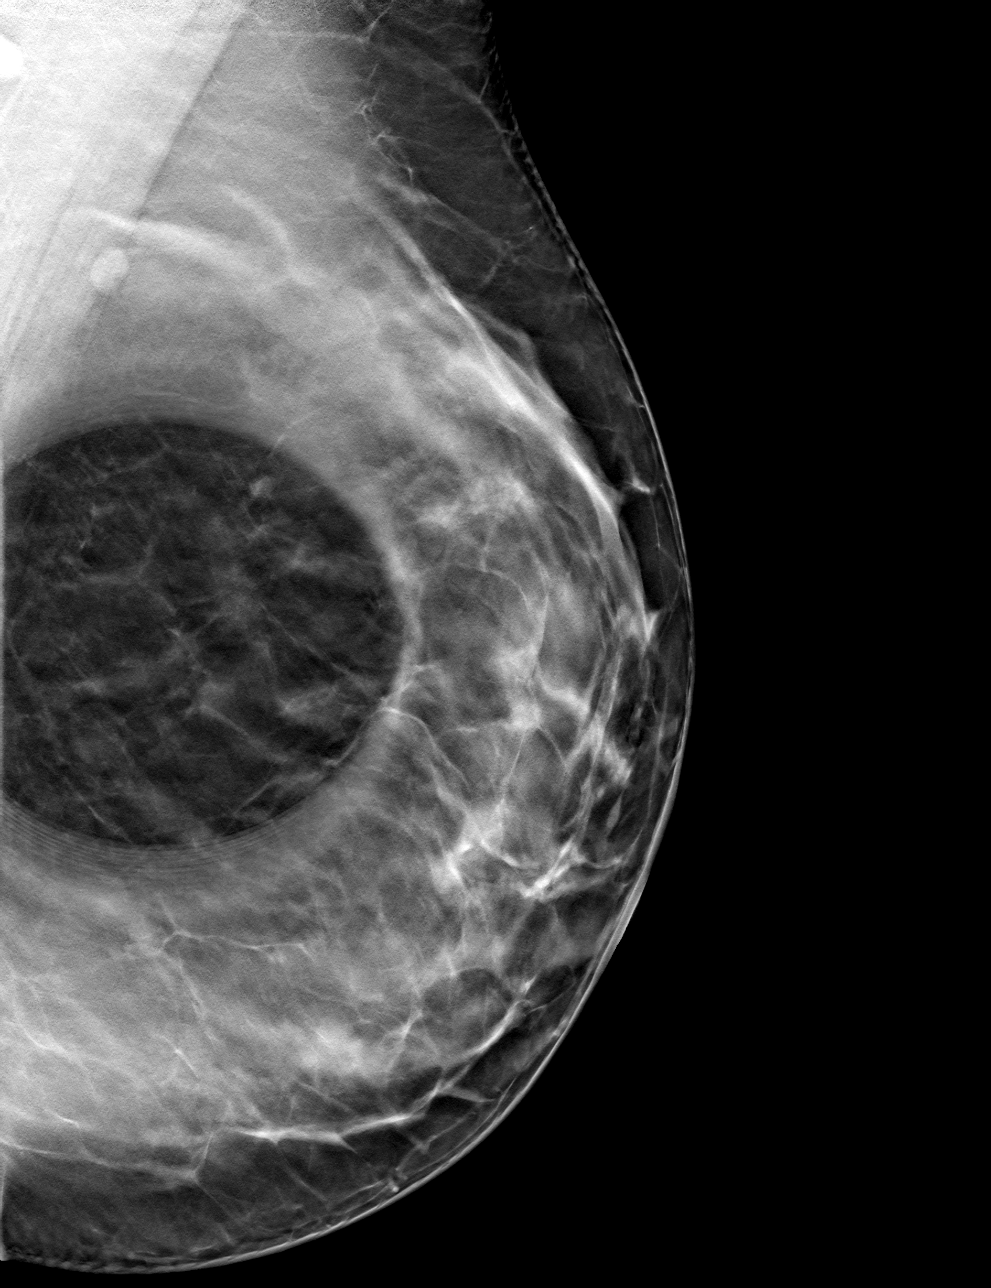

[4 of 12 positions shown; findings below may reference images not displayed]

ACR Breast Density Category c: The breast tissue is heterogeneously
dense, which may obscure small masses.
FINDINGS: Previously described, possible mass in the central lateral left
breast at posterior depth persists on today's additional views. It
appears partially circumscribed and partially obscured, equal to low
in density. Further evaluation with ultrasound was performed.

Mammographic images were processed with CAD.

Targeted ultrasound is performed, showing no focal or suspicious
sonographic abnormality. Extensive sonographic evaluation of the
entire central lateral left breast was performed.

Evaluation of the left axilla demonstrates no suspicious
lymphadenopathy.
IMPRESSION: 1. Indeterminate left breast mass without sonographic correlate.
Recommendation is for stereotactic biopsy.
2. No suspicious left axillary lymphadenopathy.

RECOMMENDATION:
Stereotactic biopsy of the left breast.

I have discussed the findings and recommendations with the patient.
Results were also provided in writing at the conclusion of the
visit. If applicable, a reminder letter will be sent to the patient
regarding the next appointment.

BI-RADS CATEGORY  4: Suspicious.

## 2020-09-24 ENCOUNTER — Other Ambulatory Visit: Payer: Self-pay

## 2020-09-24 ENCOUNTER — Ambulatory Visit (INDEPENDENT_AMBULATORY_CARE_PROVIDER_SITE_OTHER): Payer: 59 | Admitting: *Deleted

## 2020-09-24 DIAGNOSIS — L509 Urticaria, unspecified: Secondary | ICD-10-CM

## 2020-09-24 DIAGNOSIS — L501 Idiopathic urticaria: Secondary | ICD-10-CM | POA: Diagnosis not present

## 2020-10-15 ENCOUNTER — Ambulatory Visit (INDEPENDENT_AMBULATORY_CARE_PROVIDER_SITE_OTHER): Payer: 59 | Admitting: *Deleted

## 2020-10-15 ENCOUNTER — Other Ambulatory Visit: Payer: Self-pay

## 2020-10-15 DIAGNOSIS — L501 Idiopathic urticaria: Secondary | ICD-10-CM

## 2020-10-15 DIAGNOSIS — L509 Urticaria, unspecified: Secondary | ICD-10-CM

## 2020-11-06 ENCOUNTER — Ambulatory Visit: Payer: Self-pay

## 2020-11-07 ENCOUNTER — Ambulatory Visit (INDEPENDENT_AMBULATORY_CARE_PROVIDER_SITE_OTHER): Payer: 59

## 2020-11-07 ENCOUNTER — Other Ambulatory Visit: Payer: Self-pay

## 2020-11-07 DIAGNOSIS — L501 Idiopathic urticaria: Secondary | ICD-10-CM | POA: Diagnosis not present

## 2020-11-07 DIAGNOSIS — L509 Urticaria, unspecified: Secondary | ICD-10-CM

## 2020-11-20 ENCOUNTER — Other Ambulatory Visit: Payer: Self-pay | Admitting: *Deleted

## 2020-11-20 MED ORDER — OMALIZUMAB 150 MG ~~LOC~~ SOLR: 300.0000 mg | SUBCUTANEOUS | 11 refills | Status: DC

## 2020-11-21 ENCOUNTER — Ambulatory Visit: Payer: Self-pay

## 2020-11-23 ENCOUNTER — Ambulatory Visit (INDEPENDENT_AMBULATORY_CARE_PROVIDER_SITE_OTHER): Payer: 59 | Admitting: Allergy

## 2020-11-23 ENCOUNTER — Other Ambulatory Visit: Payer: Self-pay

## 2020-11-23 DIAGNOSIS — L509 Urticaria, unspecified: Secondary | ICD-10-CM

## 2020-11-23 DIAGNOSIS — L501 Idiopathic urticaria: Secondary | ICD-10-CM | POA: Diagnosis not present

## 2020-12-03 ENCOUNTER — Ambulatory Visit: Payer: 59 | Admitting: Allergy

## 2020-12-14 ENCOUNTER — Ambulatory Visit: Payer: Self-pay

## 2020-12-21 ENCOUNTER — Other Ambulatory Visit: Payer: Self-pay

## 2020-12-21 ENCOUNTER — Ambulatory Visit (INDEPENDENT_AMBULATORY_CARE_PROVIDER_SITE_OTHER): Payer: 59

## 2020-12-21 DIAGNOSIS — L501 Idiopathic urticaria: Secondary | ICD-10-CM

## 2020-12-21 DIAGNOSIS — L509 Urticaria, unspecified: Secondary | ICD-10-CM

## 2021-01-11 ENCOUNTER — Other Ambulatory Visit: Payer: Self-pay

## 2021-01-11 ENCOUNTER — Ambulatory Visit (INDEPENDENT_AMBULATORY_CARE_PROVIDER_SITE_OTHER): Payer: 59

## 2021-01-11 DIAGNOSIS — L501 Idiopathic urticaria: Secondary | ICD-10-CM

## 2021-01-11 DIAGNOSIS — L509 Urticaria, unspecified: Secondary | ICD-10-CM

## 2021-01-22 NOTE — Progress Notes (Signed)
Follow Up Note  RE: Ashley Turner MRN: [id number] DOB: 12-09-1970 Date of Office Visit: 01/23/2021  Referring provider: No ref. provider found Primary care provider: Patient, No Pcp Per (Inactive)  Chief Complaint: Follow-up and Urticaria  History of Present Illness: I had the pleasure of seeing Ashley Turner for a follow up visit at the [facility] on 01/23/2021. She is a 50 y.o. female, who is being followed for urticaria on Xolair. Her previous allergy office visit was on 07/02/2020 with Dr. Maudie Mercury. Today is a regular follow up visit.  Urticaria Currently on Xolair '300mg'$  every 3 weeks and zyrtec '10mg'$  daily with good control of symptoms.  Patient was about 1 week late for Xolair injection 3 months ago with no symptoms.  Denies any itching/rash. Epipen is up to date.  Assessment and Plan: Ashley Turner is a 50 y.o. female with: Urticaria Past history - urticaria, pruritus and episodes of angioedema. No triggers noted. Labwork unremarkable.  Interim history - stable with no issues. Takes Xolair '300mg'$  every 3 weeks and zyrtec '10mg'$  daily only. Change Xolair '300mg'$  injections to every 4 weeks after next dose. Will do this for 3 doses then change to every 5 weeks x 3 doses  then to every 6 weeks x 3 doses then change the dose to '150mg'$  every 6 weeks x 3 doses then stop. If you get hives then will go back to the dose where you didn't have any symptoms.  Monitor symptoms. Avoid the following potential triggers: alcohol, tight clothing, NSAIDs.  Stop zyrtec.  For mild symptoms you can take over the counter antihistamines such as Benadryl and monitor symptoms closely. If symptoms worsen or if you have severe symptoms including breathing issues, throat closure, significant swelling, whole body hives, severe diarrhea and vomiting, lightheadedness then inject epinephrine and seek immediate medical care afterwards.  Return in about 6 months (around 07/26/2021).  No orders of the defined  types were placed in this encounter.  Lab Orders  No laboratory test(s) ordered today    Diagnostics: None.    Medication List:  Current Outpatient Medications  Medication Sig Dispense Refill   amLODipine (NORVASC) 10 MG tablet Take 1 tablet (10 mg total) by mouth daily. 90 tablet 3   BLISOVI 24 FE 1-20 MG-MCG(24) tablet Take 1 tablet by mouth daily.      CALCIUM PO Take 1 tablet by mouth daily.     Cholecalciferol (VITAMIN D PO) Take 1 tablet by mouth daily.     EPINEPHrine (AUVI-Q) 0.3 mg/0.3 mL IJ SOAJ injection Inject 0.3 mg into the muscle as needed for anaphylaxis. 1 each 2   Multiple Vitamin (MULTIVITAMIN WITH MINERALS) TABS tablet Take 1 tablet by mouth daily.     omalizumab (XOLAIR) 150 MG injection Inject 300 mg into the skin every 14 (fourteen) days. 4 each 11   Current Facility-Administered Medications  Medication Dose Route Frequency Provider Last Rate Last Admin   omalizumab Arvid Right) injection 300 mg  300 mg Subcutaneous Q14 Days Garnet Sierras, DO   300 mg at 01/11/21 [id number]   Allergies: Allergies  Allergen Reactions   Codeine Nausea And Vomiting   I reviewed her past medical history, social history, family history, and environmental history and no significant changes have been reported from her previous visit.  Review of Systems  Constitutional:  Negative for appetite change, chills, fever and unexpected weight change.  HENT:  Negative for congestion and rhinorrhea.   Eyes:  Negative for itching.  Respiratory:  Negative for cough, chest tightness, shortness of breath and wheezing.   Cardiovascular:  Negative for chest pain.  Gastrointestinal:  Negative for abdominal pain.  Genitourinary:  Negative for difficulty urinating.  Skin:  Negative for rash.  Allergic/Immunologic: Negative for environmental allergies and food allergies.  Neurological:  Negative for headaches.   Objective: BP 122/72   Pulse 78   Temp 98.2 F (36.8 C) (Temporal)   Resp 18   SpO2 97%   There is no height or weight on file to calculate BMI. Physical Exam Vitals and nursing note reviewed.  Constitutional:      Appearance: Normal appearance. She is well-developed.  HENT:     Head: Normocephalic and atraumatic.     Right Ear: External ear normal.     Left Ear: External ear normal.     Ears:     Comments: Scarring in TM b/l.    Nose: Nose normal.     Mouth/Throat:     Mouth: Mucous membranes are moist.     Pharynx: Oropharynx is clear.  Eyes:     Conjunctiva/sclera: Conjunctivae normal.  Cardiovascular:     Rate and Rhythm: Normal rate and regular rhythm.     Heart sounds: Normal heart sounds. No murmur heard.   No friction rub. No gallop.  Pulmonary:     Effort: Pulmonary effort is normal.     Breath sounds: Normal breath sounds. No wheezing or rales.  Musculoskeletal:     Cervical back: Neck supple.  Skin:    General: Skin is warm.     Findings: No rash.  Neurological:     Mental Status: She is alert and oriented to person, place, and time.  Psychiatric:        Behavior: Behavior normal.   Previous notes and tests were reviewed. The plan was reviewed with the patient/family, and all questions/concerned were addressed.  It was my pleasure to see Ashley Turner today and participate in her care. Please feel free to contact me with any questions or concerns.  Sincerely,  Rexene Alberts, DO Allergy & Immunology  [facility] office: [place] office: [phone]

## 2021-01-23 ENCOUNTER — Ambulatory Visit: Payer: 59 | Admitting: Allergy

## 2021-01-23 ENCOUNTER — Encounter: Payer: Self-pay | Admitting: Allergy

## 2021-01-23 ENCOUNTER — Other Ambulatory Visit: Payer: Self-pay

## 2021-01-23 VITALS — BP 122/72 | HR 78 | Temp 98.2°F | Resp 18

## 2021-01-23 DIAGNOSIS — L509 Urticaria, unspecified: Secondary | ICD-10-CM

## 2021-01-23 NOTE — Assessment & Plan Note (Signed)
Past history - urticaria, pruritus and episodes of angioedema. No triggers noted. Labwork unremarkable.  Interim history - stable with no issues. Takes Xolair '300mg'$  every 3 weeks and zyrtec '10mg'$  daily only.  Change Xolair '300mg'$  injections to every 4 weeks after next dose.  Will do this for 3 doses then change to every 5 weeks x 3 doses  then to every 6 weeks x 3 doses then change the dose to '150mg'$  every 6 weeks x 3 doses then stop.  If you get hives then will go back to the dose where you didn't have any symptoms.   Monitor symptoms.  Avoid the following potential triggers: alcohol, tight clothing, NSAIDs.   Stop zyrtec.   For mild symptoms you can take over the counter antihistamines such as Benadryl and monitor symptoms closely. If symptoms worsen or if you have severe symptoms including breathing issues, throat closure, significant swelling, whole body hives, severe diarrhea and vomiting, lightheadedness then inject epinephrine and seek immediate medical care afterwards.

## 2021-01-23 NOTE — Patient Instructions (Addendum)
Urticaria Change Xolair '300mg'$  injections to every 4 weeks after your next dose. Will do this for 3 doses then change to every 5 weeks x 3 doses  then to every 6 weeks x 3 doses then change the dose to '150mg'$  every 6 weeks for 3 doses then stop if you have no symptoms. If you get hives then will go back to the dose where you didn't have any symptoms.  Monitor symptoms. Avoid the following potential triggers: alcohol, tight clothing, NSAIDs.  Stop zyrtec.  For mild symptoms you can take over the counter antihistamines such as Benadryl and monitor symptoms closely. If symptoms worsen or if you have severe symptoms including breathing issues, throat closure, significant swelling, whole body hives, severe diarrhea and vomiting, lightheadedness then inject epinephrine and seek immediate medical care afterwards.  Follow up in 6 months or sooner if needed.

## 2021-02-01 ENCOUNTER — Ambulatory Visit (INDEPENDENT_AMBULATORY_CARE_PROVIDER_SITE_OTHER): Payer: 59 | Admitting: *Deleted

## 2021-02-01 ENCOUNTER — Other Ambulatory Visit: Payer: Self-pay

## 2021-02-01 DIAGNOSIS — L509 Urticaria, unspecified: Secondary | ICD-10-CM

## 2021-02-01 DIAGNOSIS — L501 Idiopathic urticaria: Secondary | ICD-10-CM | POA: Diagnosis not present

## 2021-03-01 ENCOUNTER — Other Ambulatory Visit: Payer: Self-pay

## 2021-03-01 ENCOUNTER — Ambulatory Visit (INDEPENDENT_AMBULATORY_CARE_PROVIDER_SITE_OTHER): Payer: 59

## 2021-03-01 DIAGNOSIS — L501 Idiopathic urticaria: Secondary | ICD-10-CM

## 2021-03-01 DIAGNOSIS — L509 Urticaria, unspecified: Secondary | ICD-10-CM

## 2021-03-29 ENCOUNTER — Ambulatory Visit: Payer: 59

## 2021-03-29 ENCOUNTER — Ambulatory Visit (INDEPENDENT_AMBULATORY_CARE_PROVIDER_SITE_OTHER): Payer: 59

## 2021-03-29 ENCOUNTER — Other Ambulatory Visit: Payer: Self-pay

## 2021-03-29 DIAGNOSIS — L501 Idiopathic urticaria: Secondary | ICD-10-CM

## 2021-03-29 DIAGNOSIS — L509 Urticaria, unspecified: Secondary | ICD-10-CM

## 2021-04-01 ENCOUNTER — Encounter: Payer: Self-pay | Admitting: Podiatry

## 2021-04-01 ENCOUNTER — Other Ambulatory Visit: Payer: Self-pay

## 2021-04-01 ENCOUNTER — Ambulatory Visit: Payer: 59 | Admitting: Podiatry

## 2021-04-01 DIAGNOSIS — Q828 Other specified congenital malformations of skin: Secondary | ICD-10-CM

## 2021-04-01 DIAGNOSIS — R52 Pain, unspecified: Secondary | ICD-10-CM

## 2021-04-01 NOTE — Patient Instructions (Signed)
Look for salicylic acid 19% cream or ointment and apply to the thickened dry skin / calluses. This can be bought over the counter, at a pharmacy or online such as [employer].   The pads I gave you are called: "dancer's pads, foam callus pads, and aperture pads"

## 2021-04-01 NOTE — Progress Notes (Signed)
  Subjective:  Patient ID: Ashley Turner, female    DOB: [birth year],  MRN: [id number]  Chief Complaint  Patient presents with   Callouses     NP // Left foot callus - pain     50 y.o. female presents with the above complaint. History confirmed with patient.  Pain under the fifth toe joint on the left with a hard lesion  Objective:  Physical Exam: warm, good capillary refill, no trophic changes or ulcerative lesions, normal DP and PT pulses, and normal sensory exam. Left Foot: Porokeratosis submetatarsal 5  Assessment:   1. Porokeratosis      Plan:  Patient was evaluated and treated and all questions answered.  All symptomatic hyperkeratoses were safely debrided with a sterile #15 blade to patient's level of comfort without incident. We discussed preventative and palliative care of these lesions including supportive and accommodative shoegear, padding, prefabricated and custom molded accommodative orthoses, use of a pumice stone and lotions/creams daily.  Recommended salicylic acid cream and aperture pad or dancers pad to offload the lesion.  Return if symptoms worsen or fail to improve.

## 2021-05-06 ENCOUNTER — Ambulatory Visit (INDEPENDENT_AMBULATORY_CARE_PROVIDER_SITE_OTHER): Payer: 59

## 2021-05-06 ENCOUNTER — Other Ambulatory Visit: Payer: Self-pay

## 2021-05-06 DIAGNOSIS — L501 Idiopathic urticaria: Secondary | ICD-10-CM | POA: Diagnosis not present

## 2021-05-17 ENCOUNTER — Other Ambulatory Visit: Payer: Self-pay | Admitting: Registered Nurse

## 2021-05-17 DIAGNOSIS — I10 Essential (primary) hypertension: Secondary | ICD-10-CM

## 2021-06-02 ENCOUNTER — Other Ambulatory Visit: Payer: Self-pay | Admitting: Registered Nurse

## 2021-06-02 DIAGNOSIS — I10 Essential (primary) hypertension: Secondary | ICD-10-CM

## 2021-06-05 ENCOUNTER — Encounter: Payer: Self-pay | Admitting: Family Medicine

## 2021-06-05 ENCOUNTER — Ambulatory Visit (INDEPENDENT_AMBULATORY_CARE_PROVIDER_SITE_OTHER): Payer: 59 | Admitting: Family Medicine

## 2021-06-05 VITALS — BP 132/78 | HR 78 | Temp 98.3°F | Resp 17 | Ht 64.0 in | Wt 206.6 lb

## 2021-06-05 DIAGNOSIS — I10 Essential (primary) hypertension: Secondary | ICD-10-CM

## 2021-06-05 DIAGNOSIS — Z13220 Encounter for screening for lipoid disorders: Secondary | ICD-10-CM

## 2021-06-05 DIAGNOSIS — Z87898 Personal history of other specified conditions: Secondary | ICD-10-CM

## 2021-06-05 DIAGNOSIS — Z114 Encounter for screening for human immunodeficiency virus [HIV]: Secondary | ICD-10-CM

## 2021-06-05 DIAGNOSIS — L509 Urticaria, unspecified: Secondary | ICD-10-CM | POA: Diagnosis not present

## 2021-06-05 DIAGNOSIS — C44319 Basal cell carcinoma of skin of other parts of face: Secondary | ICD-10-CM

## 2021-06-05 DIAGNOSIS — Z1159 Encounter for screening for other viral diseases: Secondary | ICD-10-CM

## 2021-06-05 MED ORDER — AMLODIPINE BESYLATE 10 MG PO TABS: 10.0000 mg | ORAL_TABLET | Freq: Every day | ORAL | 2 refills | Status: DC

## 2021-06-05 NOTE — Addendum Note (Signed)
Addended by: Merri Ray R on: 06/05/2021 09:20 AM   Modules accepted: Orders

## 2021-06-05 NOTE — Progress Notes (Signed)
Subjective:  Patient ID: Ashley Turner, female    DOB: [birth year]  Age: 50 y.o. MRN: [id number]  CC:  Chief Complaint  Patient presents with   Establish Care    Pt here to establish PCP has had inconsistent care for some time needs her HTN checked to have refll amlodipine     HPI Ashley Turner presents for   Establish care. Prior PCP Dr. Nolon Rod at [facility].  History of hypertension, urticaria, angioedema heart murmur per problem list. 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   Gynecology, Dr. Helane Rima, appointment in September. Allergist, Dr. Scherrie Bateman [facility].  Treated with Xolair 300 mg every 4 weeks.  Dosage increased from 3 weeks to 4 weeks at her August visit, and Zyrtec discontinued at that time. Has AuviQ if needed.  Has increased to every 5 weeks now - did have some hives next day. Still taking zyrtec - for sneezing. Doing ok.  Hypertension: Amlodipine 10mg  qd. No new side effects. No missed doses.  Home readings: at her mom's house - 130/70 range.  Hx of heart murmur. Eval by cardiology - had eval with echo in 09/2017 - overall normal - few areas of trivial regurg, EF 60-65%.  Not fasting.  2 alcohol drinks per week, no tobacco.  FH: father in 82's with MI - smoker. No FH of early CAD.   BP Readings from Last 3 Encounters:  06/05/21 132/78  01/23/21 122/72  07/02/20 120/70   Lab Results  Component Value Date   CREATININE 0.87 03/18/2019   Basal Cell skin cancer: Removed above left eyebrow 12/1.  Dr. Anabel Bene at [facility], skin surgery center - [place].   HM: Agrees to HIV/HEP c screening.  Td in past 10 yrs.  Colon CA screening: fecal blood test few months ago - normal.  Pap with GYN - September - normal.  Mammogram in Sept - normal. Few abnormal in past and biopsy - normal.  Declined covid vaccine and flu vaccine.  Declined Shingrix at  this time.  History Patient Active Problem List   Diagnosis Date Noted   Urticaria 04/15/2018   Cardiac murmur 08/06/2017   Essential hypertension 09/04/2015   Past Medical History:  Diagnosis Date   Angio-edema    Heart murmur    HTN (hypertension)    Urticaria    Past Surgical History:  Procedure Laterality Date   Tubes in ears Bilateral    TYMPANOSTOMY TUBE PLACEMENT     Allergies  Allergen Reactions   Codeine Nausea And Vomiting   Prior to Admission medications   Medication Sig Start Date End Date Taking? Authorizing Provider  amLODipine (NORVASC) 10 MG tablet Take 1 tablet (10 mg total) by mouth daily. 05/25/20  Yes Maximiano Coss, NP  CALCIUM PO Take 1 tablet by mouth daily.   Yes [provider]  Cholecalciferol (VITAMIN D PO) Take 1 tablet by mouth daily.   Yes [provider]  EPINEPHrine (AUVI-Q) 0.3 mg/0.3 mL IJ SOAJ injection Inject 0.3 mg into the muscle as needed for anaphylaxis. 07/02/20  Yes Garnet Sierras, DO  Multiple Vitamin (MULTIVITAMIN WITH MINERALS) TABS tablet Take 1 tablet by mouth daily.   Yes [provider]  omalizumab Arvid Right) 150 MG injection Inject 300 mg into the skin every 14 (fourteen) days. 11/20/20  Yes Valentina Shaggy, MD   Social History  Socioeconomic History   Marital status: Single    Spouse name: Not on file   Number of children: 1   Years of education: Not on file   Highest education level: Some college, no degree  Occupational History   Occupation: inside Scientist, clinical (histocompatibility and immunogenetics): [employer]  Tobacco Use   Smoking status: Never   Smokeless tobacco: Never  Vaping Use   Vaping Use: Never used  Substance and Sexual Activity   Alcohol use: Yes    Alcohol/week: 2.0 standard drinks    Types: 2 Cans of beer per week    Comment: rare weekends   Drug use: No   Sexual activity: Yes  Other Topics Concern   Not on file  Social History Narrative   Lives with her daughter.   Mother and  sister live in [place].   Social Determinants of Health   Financial Resource Strain: Not on file  Food Insecurity: Not on file  Transportation Needs: Not on file  Physical Activity: Not on file  Stress: Not on file  Social Connections: Not on file  Intimate Partner Violence: Not on file    Review of Systems  Constitutional:  Negative for fatigue and unexpected weight change.  Respiratory:  Negative for chest tightness and shortness of breath.   Cardiovascular:  Negative for chest pain, palpitations and leg swelling.  Gastrointestinal:  Negative for abdominal pain and blood in stool.  Neurological:  Negative for dizziness, syncope, light-headedness and headaches.    Objective:   Vitals:   06/05/21 0758  BP: 132/78  Pulse: 78  Resp: 17  Temp: 98.3 F (36.8 C)  TempSrc: Temporal  SpO2: 98%  Weight: 206 lb 9.6 oz (93.7 kg)  Height: 5\' 4"  (1.626 m)     Physical Exam Vitals reviewed.  Constitutional:      Appearance: Normal appearance. She is well-developed.  HENT:     Head: Normocephalic and atraumatic.  Eyes:     Conjunctiva/sclera: Conjunctivae normal.     Pupils: Pupils are equal, round, and reactive to light.  Neck:     Vascular: No carotid bruit.  Cardiovascular:     Rate and Rhythm: Normal rate and regular rhythm.     Heart sounds: Normal heart sounds.  Pulmonary:     Effort: Pulmonary effort is normal.     Breath sounds: Normal breath sounds.  Abdominal:     Palpations: Abdomen is soft. There is no pulsatile mass.     Tenderness: There is no abdominal tenderness.  Musculoskeletal:     Right lower leg: No edema.     Left lower leg: No edema.  Skin:    General: Skin is warm and dry.     Comments: healing wound left upper face.   Neurological:     Mental Status: She is alert and oriented to person, place, and time.  Psychiatric:        Mood and Affect: Mood normal.        Behavior: Behavior normal.       Assessment & Plan:  Ashley Turner is  a 50 y.o. female . Essential hypertension - Plan: Comprehensive metabolic panel, amLODipine (NORVASC) 10 MG tablet  -  Stable, tolerating current regimen. Medications refilled. Labs pending as above.   Urticaria History of angioedema  - stable, under care af allergist.   Basal cell carcinoma (BCC) of skin of other part of face  - s/p MOHS, healing well.   Screening for  hyperlipidemia - Plan: Lipid panel, Comprehensive metabolic panel  Screening for HIV (human immunodeficiency virus) - Plan: HIV Antibody (routine testing w rflx)  Need for hepatitis C screening test - Plan: Hepatitis C antibody   Meds ordered this encounter  Medications   amLODipine (NORVASC) 10 MG tablet    Sig: Take 1 tablet (10 mg total) by mouth daily.    Dispense:  90 tablet    Refill:  2   Patient Instructions  Thank you for coming in today.  No change in amlodipine dose at this time. Fasting labs in next week or two at [place] follow-up with your specialist.  Physical in 6 months.  Let me know if there are questions in the meantime.  If there are any concerns on your labs I will let you know.  Managing Your Hypertension Hypertension, also called high blood pressure, is when the force of the blood pressing against the walls of the arteries is too strong. Arteries are blood vessels that carry blood from your heart throughout your body. Hypertension forces the heart to work harder to pump blood and may cause the arteries to become narrow or stiff. Understanding blood pressure readings Your personal target blood pressure may vary depending on your medical conditions, your age, and other factors. A blood pressure reading includes a higher number over a lower number. Ideally, your blood pressure should be below 120/80. You should know that: The first, or top, number is called the systolic pressure. It is a measure of the pressure in your arteries as your heart beats. The second, or bottom number, is called  the diastolic pressure. It is a measure of the pressure in your arteries as the heart relaxes. Blood pressure is classified into four stages. Based on your blood pressure reading, your health care provider may use the following stages to determine what type of treatment you need, if any. Systolic pressure and diastolic pressure are measured in a unit called mmHg. Normal Systolic pressure: below 376. Diastolic pressure: below 80. Elevated Systolic pressure: 283-151. Diastolic pressure: below 80. Hypertension stage 1 Systolic pressure: 761-607. Diastolic pressure: 37-10. Hypertension stage 2 Systolic pressure: 626 or above. Diastolic pressure: 90 or above. How can this condition affect me? Managing your hypertension is an important responsibility. Over time, hypertension can damage the arteries and decrease blood flow to important parts of the body, including the brain, heart, and kidneys. Having untreated or uncontrolled hypertension can lead to: A heart attack. A stroke. A weakened blood vessel (aneurysm). Heart failure. Kidney damage. Eye damage. Metabolic syndrome. Memory and concentration problems. Vascular dementia. What actions can I take to manage this condition? Hypertension can be managed by making lifestyle changes and possibly by taking medicines. Your health care provider will help you make a plan to bring your blood pressure within a normal range. Nutrition  Eat a diet that is high in fiber and potassium, and low in salt (sodium), added sugar, and fat. An example eating plan is called the Dietary Approaches to Stop Hypertension (DASH) diet. To eat this way: Eat plenty of fresh fruits and vegetables. Try to fill one-half of your plate at each meal with fruits and vegetables. Eat whole grains, such as whole-wheat pasta, brown rice, or whole-grain bread. Fill about one-fourth of your plate with whole grains. Eat low-fat dairy products. Avoid fatty cuts of meat, processed or  cured meats, and poultry with skin. Fill about one-fourth of your plate with lean proteins such as fish, chicken  without skin, beans, eggs, and tofu. Avoid pre-made and processed foods. These tend to be higher in sodium, added sugar, and fat. Reduce your daily sodium intake. Most people with hypertension should eat less than 1,500 mg of sodium a day. Lifestyle  Work with your health care provider to maintain a healthy body weight or to lose weight. Ask what an ideal weight is for you. Get at least 30 minutes of exercise that causes your heart to beat faster (aerobic exercise) most days of the week. Activities may include walking, swimming, or biking. Include exercise to strengthen your muscles (resistance exercise), such as weight lifting, as part of your weekly exercise routine. Try to do these types of exercises for 30 minutes at least 3 days a week. Do not use any products that contain nicotine or tobacco, such as cigarettes, e-cigarettes, and chewing tobacco. If you need help quitting, ask your health care provider. Control any long-term (chronic) conditions you have, such as high cholesterol or diabetes. Identify your sources of stress and find ways to manage stress. This may include meditation, deep breathing, or making time for fun activities. Alcohol use Do not drink alcohol if: Your health care provider tells you not to drink. You are pregnant, may be pregnant, or are planning to become pregnant. If you drink alcohol: Limit how much you use to: 0-1 drink a day for women. 0-2 drinks a day for men. Be aware of how much alcohol is in your drink. In the [place] one drink equals one 12 oz bottle of beer (355 mL), one 5 oz glass of wine (148 mL), or one 1 oz glass of hard liquor (44 mL). Medicines Your health care provider may prescribe medicine if lifestyle changes are not enough to get your blood pressure under control and if: Your systolic blood pressure is 130 or higher. Your diastolic  blood pressure is 80 or higher. Take medicines only as told by your health care provider. Follow the directions carefully. Blood pressure medicines must be taken as told by your health care provider. The medicine does not work as well when you skip doses. Skipping doses also puts you at risk for problems. Monitoring Before you monitor your blood pressure: Do not smoke, drink caffeinated beverages, or exercise within 30 minutes before taking a measurement. Use the bathroom and empty your bladder (urinate). Sit quietly for at least 5 minutes before taking measurements. Monitor your blood pressure at home as told by your health care provider. To do this: Sit with your back straight and supported. Place your feet flat on the floor. Do not cross your legs. Support your arm on a flat surface, such as a table. Make sure your upper arm is at heart level. Each time you measure, take two or three readings one minute apart and record the results. You may also need to have your blood pressure checked regularly by your health care provider. General information Talk with your health care provider about your diet, exercise habits, and other lifestyle factors that may be contributing to hypertension. Review all the medicines you take with your health care provider because there may be side effects or interactions. Keep all visits as told by your health care provider. Your health care provider can help you create and adjust your plan for managing your high blood pressure. Where to find more information National Heart, Lung, and Blood Institute: https://wilson-eaton.com/ American Heart Association: www.heart.org Contact a health care provider if: You think you are having a reaction to medicines  you have taken. You have repeated (recurrent) headaches. You feel dizzy. You have swelling in your ankles. You have trouble with your vision. Get help right away if: You develop a severe headache or confusion. You have  unusual weakness or numbness, or you feel faint. You have severe pain in your chest or abdomen. You vomit repeatedly. You have trouble breathing. These symptoms may represent a serious problem that is an emergency. Do not wait to see if the symptoms will go away. Get medical help right away. Call your local emergency services (911 in the [place] Do not drive yourself to the hospital. Summary Hypertension is when the force of blood pumping through your arteries is too strong. If this condition is not controlled, it may put you at risk for serious complications. Your personal target blood pressure may vary depending on your medical conditions, your age, and other factors. For most people, a normal blood pressure is less than 120/80. Hypertension is managed by lifestyle changes, medicines, or both. Lifestyle changes to help manage hypertension include losing weight, eating a healthy, low-sodium diet, exercising more, stopping smoking, and limiting alcohol. This information is not intended to replace advice given to you by your health care provider. Make sure you discuss any questions you have with your health care provider. Document Revised: 06/13/2019 Document Reviewed: 04/26/2019 Elsevier Patient Education  2022 [place],   Merri Ray, MD [place] 06/05/21 9:00 AM

## 2021-06-05 NOTE — Patient Instructions (Addendum)
Thank you for coming in today.  No change in amlodipine dose at this time. Fasting labs in next week or two at [place] follow-up with your specialist.  Physical in 6 months.  Let me know if there are questions in the meantime.  If there are any concerns on your labs I will let you know.  Managing Your Hypertension Hypertension, also called high blood pressure, is when the force of the blood pressing against the walls of the arteries is too strong. Arteries are blood vessels that carry blood from your heart throughout your body. Hypertension forces the heart to work harder to pump blood and may cause the arteries to become narrow or stiff. Understanding blood pressure readings Your personal target blood pressure may vary depending on your medical conditions, your age, and other factors. A blood pressure reading includes a higher number over a lower number. Ideally, your blood pressure should be below 120/80. You should know that: The first, or top, number is called the systolic pressure. It is a measure of the pressure in your arteries as your heart beats. The second, or bottom number, is called the diastolic pressure. It is a measure of the pressure in your arteries as the heart relaxes. Blood pressure is classified into four stages. Based on your blood pressure reading, your health care provider may use the following stages to determine what type of treatment you need, if any. Systolic pressure and diastolic pressure are measured in a unit called mmHg. Normal Systolic pressure: below 852. Diastolic pressure: below 80. Elevated Systolic pressure: 778-242. Diastolic pressure: below 80. Hypertension stage 1 Systolic pressure: 353-614. Diastolic pressure: 43-15. Hypertension stage 2 Systolic pressure: 400 or above. Diastolic pressure: 90 or above. How can this condition affect me? Managing your hypertension is an important responsibility. Over time, hypertension can damage the arteries  and decrease blood flow to important parts of the body, including the brain, heart, and kidneys. Having untreated or uncontrolled hypertension can lead to: A heart attack. A stroke. A weakened blood vessel (aneurysm). Heart failure. Kidney damage. Eye damage. Metabolic syndrome. Memory and concentration problems. Vascular dementia. What actions can I take to manage this condition? Hypertension can be managed by making lifestyle changes and possibly by taking medicines. Your health care provider will help you make a plan to bring your blood pressure within a normal range. Nutrition  Eat a diet that is high in fiber and potassium, and low in salt (sodium), added sugar, and fat. An example eating plan is called the Dietary Approaches to Stop Hypertension (DASH) diet. To eat this way: Eat plenty of fresh fruits and vegetables. Try to fill one-half of your plate at each meal with fruits and vegetables. Eat whole grains, such as whole-wheat pasta, brown rice, or whole-grain bread. Fill about one-fourth of your plate with whole grains. Eat low-fat dairy products. Avoid fatty cuts of meat, processed or cured meats, and poultry with skin. Fill about one-fourth of your plate with lean proteins such as fish, chicken without skin, beans, eggs, and tofu. Avoid pre-made and processed foods. These tend to be higher in sodium, added sugar, and fat. Reduce your daily sodium intake. Most people with hypertension should eat less than 1,500 mg of sodium a day. Lifestyle  Work with your health care provider to maintain a healthy body weight or to lose weight. Ask what an ideal weight is for you. Get at least 30 minutes of exercise that causes your heart to beat faster (aerobic  exercise) most days of the week. Activities may include walking, swimming, or biking. Include exercise to strengthen your muscles (resistance exercise), such as weight lifting, as part of your weekly exercise routine. Try to do these types  of exercises for 30 minutes at least 3 days a week. Do not use any products that contain nicotine or tobacco, such as cigarettes, e-cigarettes, and chewing tobacco. If you need help quitting, ask your health care provider. Control any long-term (chronic) conditions you have, such as high cholesterol or diabetes. Identify your sources of stress and find ways to manage stress. This may include meditation, deep breathing, or making time for fun activities. Alcohol use Do not drink alcohol if: Your health care provider tells you not to drink. You are pregnant, may be pregnant, or are planning to become pregnant. If you drink alcohol: Limit how much you use to: 0-1 drink a day for women. 0-2 drinks a day for men. Be aware of how much alcohol is in your drink. In the [place] one drink equals one 12 oz bottle of beer (355 mL), one 5 oz glass of wine (148 mL), or one 1 oz glass of hard liquor (44 mL). Medicines Your health care provider may prescribe medicine if lifestyle changes are not enough to get your blood pressure under control and if: Your systolic blood pressure is 130 or higher. Your diastolic blood pressure is 80 or higher. Take medicines only as told by your health care provider. Follow the directions carefully. Blood pressure medicines must be taken as told by your health care provider. The medicine does not work as well when you skip doses. Skipping doses also puts you at risk for problems. Monitoring Before you monitor your blood pressure: Do not smoke, drink caffeinated beverages, or exercise within 30 minutes before taking a measurement. Use the bathroom and empty your bladder (urinate). Sit quietly for at least 5 minutes before taking measurements. Monitor your blood pressure at home as told by your health care provider. To do this: Sit with your back straight and supported. Place your feet flat on the floor. Do not cross your legs. Support your arm on a flat surface, such as a  table. Make sure your upper arm is at heart level. Each time you measure, take two or three readings one minute apart and record the results. You may also need to have your blood pressure checked regularly by your health care provider. General information Talk with your health care provider about your diet, exercise habits, and other lifestyle factors that may be contributing to hypertension. Review all the medicines you take with your health care provider because there may be side effects or interactions. Keep all visits as told by your health care provider. Your health care provider can help you create and adjust your plan for managing your high blood pressure. Where to find more information National Heart, Lung, and Blood Institute: https://wilson-eaton.com/ American Heart Association: www.heart.org Contact a health care provider if: You think you are having a reaction to medicines you have taken. You have repeated (recurrent) headaches. You feel dizzy. You have swelling in your ankles. You have trouble with your vision. Get help right away if: You develop a severe headache or confusion. You have unusual weakness or numbness, or you feel faint. You have severe pain in your chest or abdomen. You vomit repeatedly. You have trouble breathing. These symptoms may represent a serious problem that is an emergency. Do not wait to see if the symptoms will  go away. Get medical help right away. Call your local emergency services (911 in the [place] Do not drive yourself to the hospital. Summary Hypertension is when the force of blood pumping through your arteries is too strong. If this condition is not controlled, it may put you at risk for serious complications. Your personal target blood pressure may vary depending on your medical conditions, your age, and other factors. For most people, a normal blood pressure is less than 120/80. Hypertension is managed by lifestyle changes, medicines, or  both. Lifestyle changes to help manage hypertension include losing weight, eating a healthy, low-sodium diet, exercising more, stopping smoking, and limiting alcohol. This information is not intended to replace advice given to you by your health care provider. Make sure you discuss any questions you have with your health care provider. Document Revised: 06/13/2019 Document Reviewed: 04/26/2019 Elsevier Patient Education  2022 [employer].

## 2021-06-11 ENCOUNTER — Other Ambulatory Visit: Payer: Self-pay

## 2021-06-11 ENCOUNTER — Ambulatory Visit (INDEPENDENT_AMBULATORY_CARE_PROVIDER_SITE_OTHER): Payer: 59 | Admitting: *Deleted

## 2021-06-11 DIAGNOSIS — L501 Idiopathic urticaria: Secondary | ICD-10-CM

## 2021-06-12 ENCOUNTER — Encounter: Payer: Self-pay | Admitting: Family Medicine

## 2021-06-13 ENCOUNTER — Other Ambulatory Visit (INDEPENDENT_AMBULATORY_CARE_PROVIDER_SITE_OTHER): Payer: 59

## 2021-06-13 DIAGNOSIS — Z114 Encounter for screening for human immunodeficiency virus [HIV]: Secondary | ICD-10-CM

## 2021-06-13 DIAGNOSIS — I10 Essential (primary) hypertension: Secondary | ICD-10-CM | POA: Diagnosis not present

## 2021-06-13 DIAGNOSIS — Z13220 Encounter for screening for lipoid disorders: Secondary | ICD-10-CM | POA: Diagnosis not present

## 2021-06-13 DIAGNOSIS — Z1159 Encounter for screening for other viral diseases: Secondary | ICD-10-CM

## 2021-06-13 LAB — LIPID PANEL
Cholesterol: 226 mg/dL — ABNORMAL HIGH (ref 0–200)
HDL: 50.2 mg/dL (ref >39.00)
LDL Cholesterol: 143 mg/dL — ABNORMAL HIGH (ref 0–99)
NonHDL: 175.36
Total CHOL/HDL Ratio: 4
Triglycerides: 162 mg/dL — ABNORMAL HIGH (ref 0.0–149.0)
VLDL: 32.4 mg/dL (ref 0.0–40.0)

## 2021-06-13 LAB — COMPREHENSIVE METABOLIC PANEL
ALT: 12 U/L (ref 0–35)
AST: 10 U/L (ref 0–37)
Albumin: 4.1 g/dL (ref 3.5–5.2)
Alkaline Phosphatase: 34 U/L — ABNORMAL LOW (ref 39–117)
BUN: 15 mg/dL (ref 6–23)
CO2: 25 mEq/L (ref 19–32)
Calcium: 9.1 mg/dL (ref 8.4–10.5)
Chloride: 107 mEq/L (ref 96–112)
Creatinine, Ser: 0.92 mg/dL (ref 0.40–1.20)
GFR: 72.79 mL/min (ref >60.00)
Glucose, Bld: 91 mg/dL (ref 70–99)
Potassium: 4 mEq/L (ref 3.5–5.1)
Sodium: 141 mEq/L (ref 135–145)
Total Bilirubin: 0.4 mg/dL (ref 0.2–1.2)
Total Protein: 7.3 g/dL (ref 6.0–8.3)

## 2021-06-13 LAB — HEPATITIS C ANTIBODY
Hepatitis C Ab: NONREACTIVE
SIGNAL TO CUT-OFF: 0.02 (ref <1.00)

## 2021-06-13 LAB — HIV ANTIBODY (ROUTINE TESTING W REFLEX): HIV 1&2 Ab, 4th Generation: NONREACTIVE

## 2021-07-16 ENCOUNTER — Other Ambulatory Visit: Payer: Self-pay

## 2021-07-16 ENCOUNTER — Ambulatory Visit (INDEPENDENT_AMBULATORY_CARE_PROVIDER_SITE_OTHER): Payer: 59

## 2021-07-16 DIAGNOSIS — L501 Idiopathic urticaria: Secondary | ICD-10-CM

## 2021-07-31 ENCOUNTER — Ambulatory Visit: Payer: 59 | Admitting: Allergy

## 2021-08-20 ENCOUNTER — Ambulatory Visit (INDEPENDENT_AMBULATORY_CARE_PROVIDER_SITE_OTHER): Payer: 59

## 2021-08-20 ENCOUNTER — Other Ambulatory Visit: Payer: Self-pay

## 2021-08-20 DIAGNOSIS — L501 Idiopathic urticaria: Secondary | ICD-10-CM | POA: Diagnosis not present

## 2021-09-09 ENCOUNTER — Ambulatory Visit: Payer: 59 | Admitting: Allergy

## 2021-09-18 ENCOUNTER — Ambulatory Visit: Payer: 59 | Admitting: Family

## 2021-09-24 ENCOUNTER — Ambulatory Visit: Payer: 59

## 2021-09-27 ENCOUNTER — Ambulatory Visit (INDEPENDENT_AMBULATORY_CARE_PROVIDER_SITE_OTHER): Payer: 59

## 2021-09-27 DIAGNOSIS — L501 Idiopathic urticaria: Secondary | ICD-10-CM

## 2021-10-01 ENCOUNTER — Ambulatory Visit: Payer: 59

## 2021-11-08 ENCOUNTER — Ambulatory Visit (INDEPENDENT_AMBULATORY_CARE_PROVIDER_SITE_OTHER): Payer: 59

## 2021-11-08 DIAGNOSIS — L501 Idiopathic urticaria: Secondary | ICD-10-CM

## 2021-11-08 MED ORDER — OMALIZUMAB 150 MG/ML ~~LOC~~ SOSY
300.0000 mg | PREFILLED_SYRINGE | SUBCUTANEOUS | Status: DC
Start: 2021-11-08 — End: 2022-01-10
  Administered 2021-11-08: 300 mg via SUBCUTANEOUS

## 2021-11-18 ENCOUNTER — Encounter: Payer: Self-pay | Admitting: Allergy

## 2021-11-18 NOTE — Telephone Encounter (Signed)
Please keep Xolair '300mg'$  at every 5 weeks.  Last dose was given on 6/2. Change her next appointment to July 7th and let patient know.   Thank you.

## 2021-11-21 MED ORDER — PREDNISONE 10 MG PO TABS: ORAL_TABLET | ORAL | 0 refills | Status: DC

## 2021-11-21 MED ORDER — MONTELUKAST SODIUM 10 MG PO TABS: 10.0000 mg | ORAL_TABLET | Freq: Every day | ORAL | 2 refills | Status: DC

## 2021-11-21 NOTE — Addendum Note (Signed)
Addended by: Garnet Sierras on: 11/21/2021 08:26 AM   Modules accepted: Orders

## 2021-11-25 ENCOUNTER — Other Ambulatory Visit: Payer: Self-pay | Admitting: Allergy & Immunology

## 2021-11-27 MED ORDER — OMALIZUMAB 150 MG ~~LOC~~ SOLR: 300.0000 mg | SUBCUTANEOUS | 11 refills | Status: DC

## 2021-11-27 NOTE — Addendum Note (Signed)
Addended by: Carin Hock on: 11/27/2021 11:15 AM   Modules accepted: Orders

## 2021-12-09 ENCOUNTER — Encounter: Payer: 59 | Admitting: Family Medicine

## 2021-12-13 ENCOUNTER — Ambulatory Visit (INDEPENDENT_AMBULATORY_CARE_PROVIDER_SITE_OTHER): Payer: 59

## 2021-12-13 DIAGNOSIS — L501 Idiopathic urticaria: Secondary | ICD-10-CM | POA: Diagnosis not present

## 2021-12-20 ENCOUNTER — Ambulatory Visit: Payer: 59

## 2021-12-24 DIAGNOSIS — L501 Idiopathic urticaria: Secondary | ICD-10-CM | POA: Insufficient documentation

## 2021-12-24 NOTE — Progress Notes (Unsigned)
Follow Up Note  RE: Ashley Turner MRN: [id number] DOB: 14-May-1971 Date of Office Visit: 12/25/2021  Referring provider: Wendie Agreste, MD Primary care provider: Wendie Agreste, MD  Chief Complaint: No chief complaint on file.  History of Present Illness: I had the pleasure of seeing [name] for a follow up visit at the [facility] [place] [facility]  on 12/24/2021. She is a 51 y.o. female, who is being followed for urticaria on Xolair. Her previous allergy office visit was on 01/23/2021 with Dr. Maudie Mercury. Today is a regular follow up visit.     11/18/21  6:56 AM I waited 6 weeks to do the Xolair this time.  Shots on 6/2, started seeing hives around Wednesday afterwards.  Just a few small places.  Last night woke up with my hands itching like crazy and took 2 Benadryl.  This morning they are swollen, itchy and red.  I don't want it to get worse.  What should I do from here?  Urticaria Past history - urticaria, pruritus and episodes of angioedema. No triggers noted. Labwork unremarkable.  Interim history - stable with no issues. Takes Xolair '300mg'$  every 3 weeks and zyrtec '10mg'$  daily only. Change Xolair '300mg'$  injections to every 4 weeks after next dose. Will do this for 3 doses then change to every 5 weeks x 3 doses  then to every 6 weeks x 3 doses then change the dose to '150mg'$  every 6 weeks x 3 doses then stop. If you get hives then will go back to the dose where you didn't have any symptoms.  Monitor symptoms. Avoid the following potential triggers: alcohol, tight clothing, NSAIDs.  Stop zyrtec.  For mild symptoms you can take over the counter antihistamines such as Benadryl and monitor symptoms closely. If symptoms worsen or if you have severe symptoms including breathing issues, throat closure, significant swelling, whole body hives, severe diarrhea and vomiting, lightheadedness then inject epinephrine and seek immediate medical care afterwards.   Return in about 6 months (around  07/26/2021).  Assessment and Plan: Ashley Turner is a 51 y.o. female with: No problem-specific Assessment & Plan notes found for this encounter.  No follow-ups on file.  No orders of the defined types were placed in this encounter.  Lab Orders  No laboratory test(s) ordered today    Diagnostics: Spirometry:  Tracings reviewed. Her effort: {Blank single:19197::"Good reproducible efforts.","It was hard to get consistent efforts and there is a question as to whether this reflects a maximal maneuver.","Poor effort, data can not be interpreted."} FVC: ***L FEV1: ***L, ***% predicted FEV1/FVC ratio: ***% Interpretation: {Blank single:19197::"Spirometry consistent with mild obstructive disease","Spirometry consistent with moderate obstructive disease","Spirometry consistent with severe obstructive disease","Spirometry consistent with possible restrictive disease","Spirometry consistent with mixed obstructive and restrictive disease","Spirometry uninterpretable due to technique","Spirometry consistent with normal pattern","No overt abnormalities noted given today's efforts"}.  Please see scanned spirometry results for details.  Skin Testing: {Blank single:19197::"Select foods","Environmental allergy panel","Environmental allergy panel and select foods","Food allergy panel","None","Deferred due to recent antihistamines use"}. *** Results discussed with patient/family.   Medication List:  Current Outpatient Medications  Medication Sig Dispense Refill   amLODipine (NORVASC) 10 MG tablet Take 1 tablet (10 mg total) by mouth daily. 90 tablet 2   CALCIUM PO Take 1 tablet by mouth daily.     Cholecalciferol (VITAMIN D PO) Take 1 tablet by mouth daily.     EPINEPHrine (AUVI-Q) 0.3 mg/0.3 mL IJ SOAJ injection Inject 0.3 mg into the muscle as needed for anaphylaxis.  1 each 2   montelukast (SINGULAIR) 10 MG tablet Take 1 tablet (10 mg total) by mouth at bedtime. 30 tablet 2   Multiple Vitamin (MULTIVITAMIN  WITH MINERALS) TABS tablet Take 1 tablet by mouth daily.     omalizumab (XOLAIR) 150 MG injection Inject 300 mg into the skin every 14 (fourteen) days. 4 each 11   predniSONE (DELTASONE) 10 MG tablet Take prednisone '40mg'$  daily x 2 days, '30mg'$  daily x 2 days, '20mg'$  daily x 2 days and '10mg'$  daily x 2 days. 20 tablet 0   Current Facility-Administered Medications  Medication Dose Route Frequency Provider Last Rate Last Admin   omalizumab Arvid Right) injection 300 mg  300 mg Subcutaneous Q14 Days Garnet Sierras, DO   300 mg at 12/13/21 1506   omalizumab Arvid Right) prefilled syringe 300 mg  300 mg Subcutaneous Q28 days Kennith Gain, MD   300 mg at 11/08/21 0930   Allergies: Allergies  Allergen Reactions   Codeine Nausea And Vomiting   I reviewed her past medical history, social history, family history, and environmental history and no significant changes have been reported from her previous visit.  Review of Systems  Constitutional:  Negative for appetite change, chills, fever and unexpected weight change.  HENT:  Negative for congestion and rhinorrhea.   Eyes:  Negative for itching.  Respiratory:  Negative for cough, chest tightness, shortness of breath and wheezing.   Cardiovascular:  Negative for chest pain.  Gastrointestinal:  Negative for abdominal pain.  Genitourinary:  Negative for difficulty urinating.  Skin:  Negative for rash.  Allergic/Immunologic: Negative for environmental allergies and food allergies.  Neurological:  Negative for headaches.    Objective: There were no vitals taken for this visit. There is no height or weight on file to calculate BMI. Physical Exam Vitals and nursing note reviewed.  Constitutional:      Appearance: Normal appearance. She is well-developed.  HENT:     Head: Normocephalic and atraumatic.     Right Ear: External ear normal.     Left Ear: External ear normal.     Ears:     Comments: Scarring in TM b/l.    Nose: Nose normal.      Mouth/Throat:     Mouth: Mucous membranes are moist.     Pharynx: Oropharynx is clear.  Eyes:     Conjunctiva/sclera: Conjunctivae normal.  Cardiovascular:     Rate and Rhythm: Normal rate and regular rhythm.     Heart sounds: Normal heart sounds. No murmur heard.    No friction rub. No gallop.  Pulmonary:     Effort: Pulmonary effort is normal.     Breath sounds: Normal breath sounds. No wheezing or rales.  Musculoskeletal:     Cervical back: Neck supple.  Skin:    General: Skin is warm.     Findings: No rash.  Neurological:     Mental Status: She is alert and oriented to person, place, and time.  Psychiatric:        Behavior: Behavior normal.    Previous notes and tests were reviewed. The plan was reviewed with the patient/family, and all questions/concerned were addressed.  It was my pleasure to see Shahad today and participate in her care. Please feel free to contact me with any questions or concerns.  Sincerely,  Rexene Alberts, DO Allergy & Immunology  [facility] office: [place] office: [phone]

## 2021-12-25 ENCOUNTER — Ambulatory Visit: Payer: 59 | Admitting: Allergy

## 2021-12-25 ENCOUNTER — Encounter: Payer: Self-pay | Admitting: Allergy

## 2021-12-25 VITALS — BP 130/84 | HR 72 | Temp 98.0°F | Resp 18 | Ht 64.0 in | Wt 217.1 lb

## 2021-12-25 DIAGNOSIS — L501 Idiopathic urticaria: Secondary | ICD-10-CM | POA: Diagnosis not present

## 2021-12-25 NOTE — Assessment & Plan Note (Addendum)
Past history - urticaria, pruritus and episodes of angioedema. No triggers noted. Labwork unremarkable.  Interim history - broke out when Xolair spaced out to every 6 weeks. Still having daily hives now despite finishing 1 course of prednisone and moving injections to every 5 weeks.  Change Xolair '300mg'$  injections to every 4 weeks.   Start prednisone taper.   Monitor symptoms.  Start allegra (fexofenadine) '180mg'$  1-2 tablets twice a day.  Use this instead of zyrtec.   If symptoms are not controlled or causes drowsiness let us know.   Continue Pepcid (famotidine) '20mg'$  twice a day.   Continue Singulair (montelukast) '10mg'$  daily at night.  Avoid the following potential triggers: alcohol, tight clothing, NSAIDs, hot showers and getting overheated.

## 2021-12-25 NOTE — Patient Instructions (Addendum)
Urticaria Change Xolair '300mg'$  injections to every 4 weeks.  Change next injection to August 4th.  Prednisone '10mg'$  tablet pack: 2 tablets given in office today. Take 2 more tablets before bed today.  Then take 2 tablets twice a day for 2 more days. Then take 2 tablets once a day for 1 day. Then take 1 tablet once a day for 1 day.   Monitor symptoms. Start allegra (fexofenadine) '180mg'$  1-2 tablets twice a day. Use this instead of zyrtec.  If symptoms are not controlled or causes drowsiness let us know. Continue Pepcid (famotidine) '20mg'$  twice a day.  Continue Singulair (montelukast) '10mg'$  daily at night. Avoid the following potential triggers: alcohol, tight clothing, NSAIDs, hot showers and getting overheated.  Follow up in 3 months or sooner if needed.

## 2022-01-10 ENCOUNTER — Ambulatory Visit (INDEPENDENT_AMBULATORY_CARE_PROVIDER_SITE_OTHER): Payer: 59

## 2022-01-10 DIAGNOSIS — L501 Idiopathic urticaria: Secondary | ICD-10-CM | POA: Diagnosis not present

## 2022-01-10 MED ORDER — OMALIZUMAB 150 MG ~~LOC~~ SOLR
300.0000 mg | SUBCUTANEOUS | Status: DC
  Administered 2022-01-10 – 2023-08-28 (×26): 300 mg via SUBCUTANEOUS

## 2022-01-17 ENCOUNTER — Ambulatory Visit: Payer: 59

## 2022-02-07 ENCOUNTER — Ambulatory Visit: Payer: 59

## 2022-02-11 ENCOUNTER — Ambulatory Visit (INDEPENDENT_AMBULATORY_CARE_PROVIDER_SITE_OTHER): Payer: 59

## 2022-02-11 DIAGNOSIS — L501 Idiopathic urticaria: Secondary | ICD-10-CM

## 2022-02-24 ENCOUNTER — Other Ambulatory Visit: Payer: Self-pay | Admitting: Allergy

## 2022-03-03 ENCOUNTER — Encounter: Payer: Self-pay | Admitting: Allergy

## 2022-03-03 NOTE — Telephone Encounter (Signed)
Spoke to patient by phone and advised she still has over $14000 in copay left

## 2022-03-05 ENCOUNTER — Other Ambulatory Visit: Payer: Self-pay | Admitting: Family Medicine

## 2022-03-05 DIAGNOSIS — I10 Essential (primary) hypertension: Secondary | ICD-10-CM

## 2022-03-05 NOTE — Telephone Encounter (Signed)
Copay straightened out with new ID number

## 2022-03-10 ENCOUNTER — Ambulatory Visit (INDEPENDENT_AMBULATORY_CARE_PROVIDER_SITE_OTHER): Payer: 59

## 2022-03-10 DIAGNOSIS — L501 Idiopathic urticaria: Secondary | ICD-10-CM | POA: Diagnosis not present

## 2022-03-18 ENCOUNTER — Encounter: Payer: Self-pay | Admitting: Allergy

## 2022-03-18 MED ORDER — PREDNISONE 10 MG PO TABS: ORAL_TABLET | ORAL | 0 refills | Status: DC

## 2022-03-25 NOTE — Progress Notes (Unsigned)
Follow Up Note  RE: Ashley Turner MRN: [id number] DOB: [birth year] Date of Office Visit: 03/26/2022  Referring provider: Wendie Agreste, MD Primary care provider: Wendie Agreste, MD  Chief Complaint: Urticaria (Still having breakouts got pretty bad last week prednisone helped  that we gave her. )  History of Present Illness: I had the pleasure of seeing Ashley Turner for a follow up visit at the [facility] [place] [facility] [place] on 03/26/2022. She is a 51 y.o. female, who is being followed for CIU on Xolair 300 mg every 4 weeks. Her previous allergy office visit was on 12/25/2021 with Dr. Maudie Mercury. Today is a regular follow up visit.  Patient is having worsening hives since June. She finished prednisone taper which helped but still having daily hives and itching making it difficult for her to sleep.  Currently on allegra 2 tabs BID, famotidine '20mg'$  BID and Singulair daily. Received Xolair '300mg'$  every 4 weeks on 03/10/2022.  Denies any changes in diet, meds, personal care products or recent infections.  Assessment and Plan: [name] is a 51 y.o. female with: Chronic idiopathic urticaria Past history - urticaria, pruritus and episodes of angioedema. No triggers noted. Labwork unremarkable. Unable to wean off Xolair.  Interim history - worsening since June and had prednisone in October and July.  Change Xolair '300mg'$  injections to every 3 weeks.  Try Xyzal '5mg'$  1-2 tablets twice a day. Use this instead of Allegra and see if it helps better.  If symptoms are not controlled or causes drowsiness let us know. Continue Pepcid (famotidine) '20mg'$  twice a day.  Continue Singulair (montelukast) '10mg'$  daily at night. Avoid the following potential triggers: alcohol, tight clothing, NSAIDs, hot showers and getting overheated. Depo '80mg'$  steroid injection given today.  If no improvement discussed trying cyclosporine or another biologic that may be approved for hives in the future.   Return in about 2 months  (around 05/26/2022).  Meds ordered this encounter  Medications   methylPREDNISolone acetate (DEPO-MEDROL) injection 80 mg   Lab Orders  No laboratory test(s) ordered today   Diagnostics: None.   Medication List:  Current Outpatient Medications  Medication Sig Dispense Refill   amLODipine (NORVASC) 10 MG tablet TAKE 1 TABLET BY MOUTH EVERY DAY 90 tablet 2   CALCIUM PO Take 1 tablet by mouth daily.     Cholecalciferol (VITAMIN D PO) Take 1 tablet by mouth daily.     EPINEPHrine (AUVI-Q) 0.3 mg/0.3 mL IJ SOAJ injection Inject 0.3 mg into the muscle as needed for anaphylaxis. 1 each 2   famotidine (PEPCID) 20 MG tablet Take 20 mg by mouth 2 (two) times daily.     fexofenadine (ALLEGRA) 180 MG tablet Take 180 mg by mouth daily.     montelukast (SINGULAIR) 10 MG tablet TAKE 1 TABLET BY MOUTH EVERYDAY AT BEDTIME 30 tablet 5   Multiple Vitamin (MULTIVITAMIN WITH MINERALS) TABS tablet Take 1 tablet by mouth daily.     Current Facility-Administered Medications  Medication Dose Route Frequency Provider Last Rate Last Admin   omalizumab Arvid Right) injection 300 mg  300 mg Subcutaneous Q28 days Garnet Sierras, DO   300 mg at 03/10/22 8413   Allergies: Allergies  Allergen Reactions   Codeine Nausea And Vomiting   I reviewed her past medical history, social history, family history, and environmental history and no significant changes have been reported from her previous visit.  Review of Systems  Constitutional:  Negative for appetite change, chills, fever and unexpected  weight change.  HENT:  Negative for congestion and rhinorrhea.   Eyes:  Negative for itching.  Respiratory:  Negative for cough, chest tightness, shortness of breath and wheezing.   Cardiovascular:  Negative for chest pain.  Gastrointestinal:  Negative for abdominal pain.  Genitourinary:  Negative for difficulty urinating.  Skin:  Positive for rash.  Allergic/Immunologic: Negative for environmental allergies and food  allergies.  Neurological:  Negative for headaches.    Objective: BP 134/88   Pulse 94   Temp 98.5 F (36.9 C) (Temporal)   Resp 16   SpO2 98%  There is no height or weight on file to calculate BMI. Physical Exam Vitals and nursing note reviewed.  Constitutional:      Appearance: Normal appearance. She is well-developed.  HENT:     Head: Normocephalic and atraumatic.     Right Ear: External ear normal.     Left Ear: External ear normal.     Ears:     Comments: Scarring in TM b/l.    Nose: Nose normal.     Mouth/Throat:     Mouth: Mucous membranes are moist.     Pharynx: Oropharynx is clear.  Eyes:     Conjunctiva/sclera: Conjunctivae normal.  Cardiovascular:     Rate and Rhythm: Normal rate and regular rhythm.     Heart sounds: Normal heart sounds. No murmur heard.    No friction rub. No gallop.  Pulmonary:     Effort: Pulmonary effort is normal.     Breath sounds: Normal breath sounds. No wheezing or rales.  Musculoskeletal:     Cervical back: Neck supple.  Skin:    General: Skin is warm.     Findings: Rash present.     Comments: Scattered circular erythematous patches on upper extremities b/l and few areas on the torso.  Neurological:     Mental Status: She is alert and oriented to person, place, and time.  Psychiatric:        Behavior: Behavior normal.    Previous notes and tests were reviewed. The plan was reviewed with the patient/family, and all questions/concerned were addressed.  It was my pleasure to see Ashley Turner today and participate in her care. Please feel free to contact me with any questions or concerns.  Sincerely,  Rexene Alberts, DO Allergy & Immunology  [facility] office: [place] office: [phone]

## 2022-03-26 ENCOUNTER — Encounter: Payer: Self-pay | Admitting: Allergy

## 2022-03-26 ENCOUNTER — Ambulatory Visit: Payer: 59 | Admitting: Allergy

## 2022-03-26 VITALS — BP 134/88 | HR 94 | Temp 98.5°F | Resp 16

## 2022-03-26 DIAGNOSIS — L501 Idiopathic urticaria: Secondary | ICD-10-CM | POA: Diagnosis not present

## 2022-03-26 MED ORDER — METHYLPREDNISOLONE ACETATE 80 MG/ML IJ SUSP
80.0000 mg | Freq: Once | INTRAMUSCULAR | Status: AC
  Administered 2022-03-26: 80 mg via INTRAMUSCULAR

## 2022-03-26 NOTE — Patient Instructions (Addendum)
Urticaria Xolair '300mg'$  injections to every 3 weeks.   Try Xyzal '5mg'$  1-2 tablets twice a day. Use this instead of Allegra and see if it helps better.  If symptoms are not controlled or causes drowsiness let us know. Continue Pepcid (famotidine) '20mg'$  twice a day.  Continue Singulair (montelukast) '10mg'$  daily at night. Avoid the following potential triggers: alcohol, tight clothing, NSAIDs, hot showers and getting overheated. Depo '80mg'$  steroid injection given today.   Follow up in 2 months or sooner if needed.

## 2022-03-26 NOTE — Assessment & Plan Note (Addendum)
Past history - urticaria, pruritus and episodes of angioedema. No triggers noted. Labwork unremarkable. Unable to wean off Xolair.  Interim history - worsening since June and had prednisone in October and July.   Change Xolair '300mg'$  injections to every 3 weeks.   Try Xyzal '5mg'$  1-2 tablets twice a day.  Use this instead of Allegra and see if it helps better.   If symptoms are not controlled or causes drowsiness let us know.  Continue Pepcid (famotidine) '20mg'$  twice a day.   Continue Singulair (montelukast) '10mg'$  daily at night.  Avoid the following potential triggers: alcohol, tight clothing, NSAIDs, hot showers and getting overheated.  Depo '80mg'$  steroid injection given today.   If no improvement discussed trying cyclosporine or another biologic that may be approved for hives in the future.

## 2022-03-31 ENCOUNTER — Ambulatory Visit (INDEPENDENT_AMBULATORY_CARE_PROVIDER_SITE_OTHER): Payer: 59

## 2022-03-31 DIAGNOSIS — L501 Idiopathic urticaria: Secondary | ICD-10-CM

## 2022-04-07 ENCOUNTER — Ambulatory Visit: Payer: 59

## 2022-04-21 ENCOUNTER — Ambulatory Visit (INDEPENDENT_AMBULATORY_CARE_PROVIDER_SITE_OTHER): Payer: 59

## 2022-04-21 DIAGNOSIS — L501 Idiopathic urticaria: Secondary | ICD-10-CM

## 2022-05-08 ENCOUNTER — Telehealth: Payer: Self-pay

## 2022-05-08 NOTE — Telephone Encounter (Signed)
Patient has an appointment for xolair @ 10:00am, but plans to come in at 8:30am due to having an appointment in the same morning 1 hour away. I made a note on the scheduled day.

## 2022-05-08 NOTE — Telephone Encounter (Signed)
Noted, thank you

## 2022-05-12 ENCOUNTER — Ambulatory Visit (INDEPENDENT_AMBULATORY_CARE_PROVIDER_SITE_OTHER): Payer: 59

## 2022-05-12 DIAGNOSIS — L501 Idiopathic urticaria: Secondary | ICD-10-CM

## 2022-05-26 ENCOUNTER — Ambulatory Visit: Payer: 59 | Admitting: Allergy

## 2022-06-05 ENCOUNTER — Ambulatory Visit (INDEPENDENT_AMBULATORY_CARE_PROVIDER_SITE_OTHER): Payer: 59

## 2022-06-05 DIAGNOSIS — L501 Idiopathic urticaria: Secondary | ICD-10-CM

## 2022-06-13 ENCOUNTER — Ambulatory Visit: Payer: 59 | Admitting: Allergy

## 2022-06-27 ENCOUNTER — Ambulatory Visit (INDEPENDENT_AMBULATORY_CARE_PROVIDER_SITE_OTHER): Payer: 59

## 2022-06-27 DIAGNOSIS — L501 Idiopathic urticaria: Secondary | ICD-10-CM | POA: Diagnosis not present

## 2022-07-18 ENCOUNTER — Ambulatory Visit (INDEPENDENT_AMBULATORY_CARE_PROVIDER_SITE_OTHER): Payer: 59 | Admitting: *Deleted

## 2022-07-18 DIAGNOSIS — L501 Idiopathic urticaria: Secondary | ICD-10-CM

## 2022-08-08 ENCOUNTER — Ambulatory Visit (INDEPENDENT_AMBULATORY_CARE_PROVIDER_SITE_OTHER): Payer: 59 | Admitting: *Deleted

## 2022-08-08 DIAGNOSIS — L501 Idiopathic urticaria: Secondary | ICD-10-CM | POA: Diagnosis not present

## 2022-08-18 ENCOUNTER — Ambulatory Visit: Payer: 59 | Admitting: Allergy

## 2022-08-25 ENCOUNTER — Other Ambulatory Visit: Payer: Self-pay | Admitting: Allergy

## 2022-08-29 ENCOUNTER — Ambulatory Visit (INDEPENDENT_AMBULATORY_CARE_PROVIDER_SITE_OTHER): Payer: 59

## 2022-08-29 DIAGNOSIS — L501 Idiopathic urticaria: Secondary | ICD-10-CM

## 2022-09-19 ENCOUNTER — Ambulatory Visit: Payer: 59

## 2022-09-21 ENCOUNTER — Other Ambulatory Visit: Payer: Self-pay | Admitting: Allergy

## 2022-09-23 ENCOUNTER — Ambulatory Visit (INDEPENDENT_AMBULATORY_CARE_PROVIDER_SITE_OTHER): Payer: 59

## 2022-09-23 DIAGNOSIS — L501 Idiopathic urticaria: Secondary | ICD-10-CM

## 2022-09-24 ENCOUNTER — Ambulatory Visit: Payer: 59

## 2022-09-24 ENCOUNTER — Ambulatory Visit: Payer: 59 | Admitting: Allergy

## 2022-09-26 ENCOUNTER — Ambulatory Visit: Payer: Self-pay

## 2022-09-28 NOTE — Progress Notes (Unsigned)
Follow Up Note  RE: Ashley Turner MRN: [id number] DOB: 01-10-1971 Date of Office Visit: 09/29/2022  Referring provider: Shade Flood, MD Primary care provider: Shade Flood, MD  Chief Complaint: Urticaria (Some small flares )  History of Present Illness: I had the pleasure of seeing Ashley Turner for a follow up visit at the [facility] [place] on 09/29/2022. She is a 52 y.o. female, who is being followed for CIU on Xolair 300 mg every 3 weeks. Her previous allergy office visit was on 03/26/2022 with Dr. Selena Batten. Today is a regular follow up visit.  Chronic idiopathic urticaria Currently on Xolair  injections every 3 weeks, Xyzal  BID, famotidine  BID, Singulair  daily.  Breaks out about once a week in the mornings which resolve within a few hours.  No steroids since the last visit.   In February patient had gum disease and had all her teeth removed. This did not improve the hives.   She doesn't notice a difference between taking Xyzal or allegra.  Content with current regimen.  Rarely she misses her medications - no flares during those times.  She had hives on her left wrist this morning - that's where she also wears her apple watch overnight to track her sleep.   Assessment and Plan: [name] is a 52 y.o. female with: Chronic idiopathic urticaria Past history - urticaria, pruritus and episodes of angioedema. No triggers noted. Labwork unremarkable. Unable to wean off Xolair.  Interim history - once per week small hive outbreak in the mornings. Content with below regimen. No difference between taking Xyzal, allegra or zyrtec. Continue Xolair  injections every 3 weeks.  You may try to take allegra, zyrtec or Xyzal (2 pills) twice a day. If symptoms are not controlled or causes drowsiness let us know. Continue Pepcid (famotidine)  twice a day.  Continue Singulair (montelukast)  daily at night. Continue proper skin care.  Avoid the  following potential triggers: alcohol, tight clothing, NSAIDs, hot showers and getting overheated. If symptom free at next visit, will discuss weaning off the oral meds.   Return in about 6 months (around 03/31/2023).  No orders of the defined types were placed in this encounter.  Lab Orders  No laboratory test(s) ordered today    Diagnostics: None.   Medication List:  Current Outpatient Medications  Medication Sig Dispense Refill   amLODipine (NORVASC) 10 MG tablet TAKE 1 TABLET BY MOUTH EVERY DAY 90 tablet 2   CALCIUM PO Take 1 tablet by mouth daily.     Cholecalciferol (VITAMIN D PO) Take 1 tablet by mouth daily.     EPINEPHrine (AUVI-Q) 0.3 mg/0.3 mL IJ SOAJ injection Inject 0.3 mg into the muscle as needed for anaphylaxis. 1 each 2   famotidine (PEPCID) 20 MG tablet Take 20 mg by mouth 2 (two) times daily.     fexofenadine (ALLEGRA) 180 MG tablet Take 180 mg by mouth daily.     montelukast (SINGULAIR) 10 MG tablet TAKE 1 TABLET BY MOUTH EVERYDAY AT BEDTIME 30 tablet 0   Multiple Vitamin (MULTIVITAMIN WITH MINERALS) TABS tablet Take 1 tablet by mouth daily.     Current Facility-Administered Medications  Medication Dose Route Frequency Provider Last Rate Last Admin   omalizumab Geoffry Paradise) injection 300 mg  300 mg Subcutaneous Q28 days Ellamae Sia, DO   300 mg at 09/23/22 1746   Allergies: Allergies  Allergen Reactions   Codeine Nausea And Vomiting   I reviewed  her past medical history, social history, family history, and environmental history and no significant changes have been reported from her previous visit.  Review of Systems  Constitutional:  Negative for appetite change, chills, fever and unexpected weight change.  HENT:  Negative for congestion and rhinorrhea.   Eyes:  Negative for itching.  Respiratory:  Negative for cough, chest tightness, shortness of breath and wheezing.   Cardiovascular:  Negative for chest pain.  Gastrointestinal:  Negative for abdominal  pain.  Genitourinary:  Negative for difficulty urinating.  Skin:  Positive for rash.  Allergic/Immunologic: Negative for environmental allergies and food allergies.  Neurological:  Negative for headaches.    Objective: BP 126/84   Pulse 80   Temp 98.7 F (37.1 C)   Resp 18   Ht  (1.6 m)   Wt 217 lb 3.2 oz (98.5 kg)   SpO2 95%   BMI 38.48 kg/m  Body mass index is 38.48 kg/m. Physical Exam Vitals and nursing note reviewed.  Constitutional:      Appearance: Normal appearance. She is well-developed.  HENT:     Head: Normocephalic and atraumatic.     Right Ear: External ear normal.     Left Ear: External ear normal.     Ears:     Comments: Scarring in TM b/l.    Nose: Nose normal.     Mouth/Throat:     Mouth: Mucous membranes are moist.     Pharynx: Oropharynx is clear.  Eyes:     Conjunctiva/sclera: Conjunctivae normal.  Cardiovascular:     Rate and Rhythm: Normal rate and regular rhythm.     Heart sounds: Normal heart sounds. No murmur heard.    No friction rub. No gallop.  Pulmonary:     Effort: Pulmonary effort is normal.     Breath sounds: Normal breath sounds. No wheezing or rales.  Musculoskeletal:     Cervical back: Neck supple.  Skin:    General: Skin is warm.     Findings: Rash present.     Comments: Faint erythematous patch on left wrist area.   Neurological:     Mental Status: She is alert and oriented to person, place, and time.  Psychiatric:        Behavior: Behavior normal.    Previous notes and tests were reviewed. The plan was reviewed with the patient/family, and all questions/concerned were addressed.  It was my pleasure to see Ashley Turner today and participate in her care. Please feel free to contact me with any questions or concerns.  Sincerely,  Wyline Mood, DO Allergy & Immunology  [facility] office: [phone] [facility] office: [phone]

## 2022-09-29 ENCOUNTER — Ambulatory Visit (INDEPENDENT_AMBULATORY_CARE_PROVIDER_SITE_OTHER): Payer: 59 | Admitting: Allergy

## 2022-09-29 ENCOUNTER — Other Ambulatory Visit: Payer: Self-pay

## 2022-09-29 ENCOUNTER — Encounter: Payer: Self-pay | Admitting: Allergy

## 2022-09-29 VITALS — BP 126/84 | HR 80 | Temp 98.7°F | Resp 18 | Ht 63.0 in | Wt 217.2 lb

## 2022-09-29 DIAGNOSIS — L501 Idiopathic urticaria: Secondary | ICD-10-CM

## 2022-09-29 NOTE — Assessment & Plan Note (Signed)
Past history - urticaria, pruritus and episodes of angioedema. No triggers noted. Labwork unremarkable. Unable to wean off Xolair.  Interim history - once per week small hive outbreak in the mornings. Content with below regimen. No difference between taking Xyzal, allegra or zyrtec. Continue Xolair  injections every 3 weeks.  You may try to take allegra, zyrtec or Xyzal (2 pills) twice a day. If symptoms are not controlled or causes drowsiness let us know. Continue Pepcid (famotidine)  twice a day.  Continue Singulair (montelukast)  daily at night. Continue proper skin care.  Avoid the following potential triggers: alcohol, tight clothing, NSAIDs, hot showers and getting overheated. If symptom free at next visit, will discuss weaning off the oral meds.

## 2022-09-29 NOTE — Patient Instructions (Addendum)
Urticaria Continue Xolair  injections every 3 weeks.   You may try to take allegra, zyrtec or Xyzal twice a day. No more than 4 pills total.  If symptoms are not controlled or causes drowsiness let us know. Continue Pepcid (famotidine)  twice a day.  Continue Singulair (montelukast)  daily at night. Continue proper skin care.  Avoid the following potential triggers: alcohol, tight clothing, NSAIDs, hot showers and getting overheated.  Follow up in 6 months or sooner if needed.   Skin care recommendations  Bath time: Always use lukewarm water. AVOID very hot or cold water. Keep bathing time to 5-10 minutes. Do NOT use bubble bath. Use a mild soap and use just enough to wash the dirty areas. Do NOT scrub skin vigorously.  After bathing, pat dry your skin with a towel. Do NOT rub or scrub the skin.  Moisturizers and prescriptions:  ALWAYS apply moisturizers immediately after bathing (within 3 minutes). This helps to lock-in moisture. Use the moisturizer several times a day over the whole body. Good summer moisturizers include: Aveeno, CeraVe, Cetaphil. Good winter moisturizers include: Aquaphor, Vaseline, Cerave, Cetaphil, Eucerin, Vanicream. When using moisturizers along with medications, the moisturizer should be applied about one hour after applying the medication to prevent diluting effect of the medication or moisturize around where you applied the medications. When not using medications, the moisturizer can be continued twice daily as maintenance.  Laundry and clothing: Avoid laundry products with added color or perfumes. Use unscented hypo-allergenic laundry products such as Tide free, Cheer free & gentle, and All free and clear.  If the skin still seems dry or sensitive, you can try double-rinsing the clothes. Avoid tight or scratchy clothing such as wool. Do not use fabric softeners or dyer sheets.

## 2022-09-30 ENCOUNTER — Ambulatory Visit: Payer: 59

## 2022-10-17 ENCOUNTER — Ambulatory Visit (INDEPENDENT_AMBULATORY_CARE_PROVIDER_SITE_OTHER): Payer: 59 | Admitting: *Deleted

## 2022-10-17 DIAGNOSIS — L501 Idiopathic urticaria: Secondary | ICD-10-CM

## 2022-10-27 ENCOUNTER — Other Ambulatory Visit: Payer: Self-pay | Admitting: Allergy

## 2022-11-07 ENCOUNTER — Ambulatory Visit (INDEPENDENT_AMBULATORY_CARE_PROVIDER_SITE_OTHER): Payer: 59

## 2022-11-07 DIAGNOSIS — L501 Idiopathic urticaria: Secondary | ICD-10-CM | POA: Diagnosis not present

## 2022-11-26 ENCOUNTER — Other Ambulatory Visit: Payer: Self-pay

## 2022-11-26 ENCOUNTER — Other Ambulatory Visit: Payer: Self-pay | Admitting: Family Medicine

## 2022-11-26 DIAGNOSIS — I10 Essential (primary) hypertension: Secondary | ICD-10-CM

## 2022-11-26 MED ORDER — AMLODIPINE BESYLATE 10 MG PO TABS: 10.0000 mg | ORAL_TABLET | Freq: Every day | ORAL | 0 refills | Status: DC

## 2022-11-26 NOTE — Telephone Encounter (Signed)
Overdue for follow-up with me but has been followed by allergy, blood pressure 126/84 at her April 22 visit.  Creatinine has been normal, and most recently checked in January 2023.  I will agree to refill medications until her planned visit in July, needs to keep that appointment.  Refill ordered.

## 2022-11-28 ENCOUNTER — Ambulatory Visit (INDEPENDENT_AMBULATORY_CARE_PROVIDER_SITE_OTHER): Payer: 59 | Admitting: *Deleted

## 2022-11-28 DIAGNOSIS — L501 Idiopathic urticaria: Secondary | ICD-10-CM | POA: Diagnosis not present

## 2022-12-15 ENCOUNTER — Other Ambulatory Visit: Payer: Self-pay | Admitting: Allergy & Immunology

## 2022-12-19 ENCOUNTER — Ambulatory Visit (INDEPENDENT_AMBULATORY_CARE_PROVIDER_SITE_OTHER): Payer: 59

## 2022-12-19 ENCOUNTER — Other Ambulatory Visit: Payer: Self-pay | Admitting: Family Medicine

## 2022-12-19 DIAGNOSIS — I10 Essential (primary) hypertension: Secondary | ICD-10-CM

## 2022-12-19 DIAGNOSIS — L501 Idiopathic urticaria: Secondary | ICD-10-CM

## 2022-12-27 ENCOUNTER — Other Ambulatory Visit: Payer: Self-pay | Admitting: Family Medicine

## 2022-12-27 DIAGNOSIS — I10 Essential (primary) hypertension: Secondary | ICD-10-CM

## 2023-01-01 ENCOUNTER — Ambulatory Visit: Payer: 59 | Admitting: Family Medicine

## 2023-01-05 ENCOUNTER — Encounter: Payer: Self-pay | Admitting: Family Medicine

## 2023-01-05 ENCOUNTER — Ambulatory Visit: Payer: 59 | Admitting: Family Medicine

## 2023-01-05 VITALS — BP 136/80 | HR 81 | Temp 98.1°F | Ht 63.0 in | Wt 217.2 lb

## 2023-01-05 DIAGNOSIS — Z13220 Encounter for screening for lipoid disorders: Secondary | ICD-10-CM

## 2023-01-05 DIAGNOSIS — Z1211 Encounter for screening for malignant neoplasm of colon: Secondary | ICD-10-CM

## 2023-01-05 DIAGNOSIS — I10 Essential (primary) hypertension: Secondary | ICD-10-CM | POA: Diagnosis not present

## 2023-01-05 MED ORDER — AMLODIPINE BESYLATE 10 MG PO TABS
10.0000 mg | ORAL_TABLET | Freq: Every day | ORAL | 2 refills | Status: DC
Start: 2023-01-05 — End: 2023-12-21

## 2023-01-05 NOTE — Progress Notes (Signed)
Subjective:  Patient ID: Ashley Turner, female    DOB: 05-Mar-1971  Age: 52 y.o. MRN: [id number]  CC:  Chief Complaint  Patient presents with   Medical Management of Chronic Issues    Pt not seen since 05/2021, pt is well denied questions or concerns today     HPI Ashley Turner presents for  Follow up. Last visit with me in 2022.   Hypertension: Amlodipine 10mg  every day. No missed doses. No home readings. Not fasting currently.  Some added salt, take out or fast food occasionally.   BP Readings from Last 3 Encounters:  01/05/23 136/80  09/29/22 126/84  03/26/22 134/88   Lab Results  Component Value Date   CREATININE 0.92 06/13/2021    Chronic idiopathic uriticaria Had been doing well, weaned meds, then recurred since last June. Followed by allergist, Dr. Selena Batten. 2 xyrtec in am, pepcid BID, 2 xyzal at night, singulair  at night and xolair    HM: No FH of colon CA, no hx of bleeding/polyps.  Screening options with colonoscopy versus Cologuard discussed. Discussed timing of repeat testing intervals if normal, as well as potential need for diagnostic Colonoscopy if positive Cologuard. Understanding expressed, and chose Cologuard.    History Patient Active Problem List   Diagnosis Date Noted   Chronic idiopathic urticaria 12/24/2021   Cardiac murmur 08/06/2017   Essential hypertension 09/04/2015   Past Medical History:  Diagnosis Date   Angio-edema    Heart murmur    HTN (hypertension)    Urticaria    Past Surgical History:  Procedure Laterality Date   Tubes in ears Bilateral    TYMPANOSTOMY TUBE PLACEMENT     Allergies  Allergen Reactions   Codeine Nausea And Vomiting   Prior to Admission medications   Medication Sig Start Date End Date Taking? Authorizing Provider  amLODipine (NORVASC) 10 MG tablet TAKE 1 TABLET BY MOUTH EVERY DAY 11/26/22  Yes Shade Flood, MD  amLODipine (NORVASC) 10 MG tablet TAKE 1 TABLET BY MOUTH EVERY DAY 12/29/22  Yes Shade Flood, MD  CALCIUM PO Take 1 tablet by mouth daily.   Yes [provider]  cetirizine (ZYRTEC) 10 MG tablet Take 20 mg by mouth daily. Pt reports 20 mg am and Xyzal in PM   Yes [provider]  Cholecalciferol (VITAMIN D PO) Take 1 tablet by mouth daily.   Yes [provider]  EPINEPHrine (AUVI-Q) 0.3 mg/0.3 mL IJ SOAJ injection Inject 0.3 mg into the muscle as needed for anaphylaxis. 07/02/20  Yes Ellamae Sia, DO  famotidine (PEPCID) 20 MG tablet Take 20 mg by mouth 2 (two) times daily.   Yes [provider]  levocetirizine (XYZAL) 5 MG tablet Take 10 mg by mouth every evening.   Yes [provider]  montelukast (SINGULAIR) 10 MG tablet TAKE 1 TABLET BY MOUTH EVERYDAY AT BEDTIME 10/27/22  Yes Ellamae Sia, DO  Multiple Vitamin (MULTIVITAMIN WITH MINERALS) TABS tablet Take 1 tablet by mouth daily.   Yes [provider]  XOLAIR 150 MG injection INJECT 300MG  SUBCUTANEOUSLY  EVERY 2 WEEKS 12/15/22  Yes Alfonse Spruce, MD  fexofenadine (ALLEGRA) 180 MG tablet Take 180 mg by mouth daily. Patient not taking: Reported on 01/05/2023    [provider]   Social History   Socioeconomic History   Marital status: Single    Spouse name: Not on file   Number of children: 1   Years of education: Not  on file   Highest education level: Some college, no degree  Occupational History   Occupation: inside Investment banker, corporate: [employer]  Tobacco Use   Smoking status: Never   Smokeless tobacco: Never  Vaping Use   Vaping status: Never Used  Substance and Sexual Activity   Alcohol use: Yes    Alcohol/week: 2.0 standard drinks of alcohol    Types: 2 Cans of beer per week    Comment: rare weekends   Drug use: No   Sexual activity: Yes  Other Topics Concern   Not on file  Social History Narrative   Lives with her daughter.   Mother and sister live in [place].   Social Determinants of Health   Financial Resource  Strain: Not on file  Food Insecurity: Not on file  Transportation Needs: Not on file  Physical Activity: Not on file  Stress: Not on file  Social Connections: Not on file  Intimate Partner Violence: Not on file    Review of Systems  Constitutional:  Negative for fatigue and unexpected weight change.  Respiratory:  Negative for chest tightness and shortness of breath.   Cardiovascular:  Negative for chest pain, palpitations and leg swelling.  Gastrointestinal:  Negative for abdominal pain and blood in stool.  Neurological:  Negative for dizziness, syncope, light-headedness and headaches.     Objective:   Vitals:   01/05/23 1037  BP: 136/80  Pulse: 81  Temp: 98.1 F (36.7 C)  TempSrc: Temporal  SpO2: 97%  Weight: 217 lb 3.2 oz (98.5 kg)  Height: 5\' 3"  (1.6 m)     Physical Exam Vitals reviewed.  Constitutional:      Appearance: Normal appearance. She is well-developed.  HENT:     Head: Normocephalic and atraumatic.  Eyes:     Conjunctiva/sclera: Conjunctivae normal.     Pupils: Pupils are equal, round, and reactive to light.  Neck:     Vascular: No carotid bruit.  Cardiovascular:     Rate and Rhythm: Normal rate and regular rhythm.     Heart sounds: Normal heart sounds.  Pulmonary:     Effort: Pulmonary effort is normal.     Breath sounds: Normal breath sounds.  Abdominal:     Palpations: Abdomen is soft. There is no pulsatile mass.     Tenderness: There is no abdominal tenderness.  Musculoskeletal:     Right lower leg: No edema.     Left lower leg: No edema.  Skin:    General: Skin is warm and dry.  Neurological:     Mental Status: She is alert and oriented to person, place, and time.  Psychiatric:        Mood and Affect: Mood normal.        Behavior: Behavior normal.        Assessment & Plan:  Ashley Turner is a 52 y.o. female . Essential hypertension - Plan: amLODipine (NORVASC) 10 MG tablet, Comprehensive metabolic panel  -Borderline control,  continue same dose amlodipine, handout on salty 6 foods to avoid, handout given on management of hypertension.  Recheck 6 months.  Screening for colon cancer - Plan: Cologuard  Screening for hyperlipidemia - Plan: Lipid panel   Meds ordered this encounter  Medications   amLODipine (NORVASC) 10 MG tablet    Sig: Take 1 tablet (10 mg total) by mouth daily.    Dispense:  90 tablet    Refill:  2   Patient Instructions  No change in medications for now.  Blood pressure is borderline but if you are able to cut back a little bit on the diet that will improve those numbers effectively.  See handout on foods to avoid as well as information below.  If any concerns on labs I will let you know.  Cologuard was ordered.  Thank you for coming today and I will see you in 6 months.  Managing Your Hypertension Hypertension, also called high blood pressure, is when the force of the blood pressing against the walls of the arteries is too strong. Arteries are blood vessels that carry blood from your heart throughout your body. Hypertension forces the heart to work harder to pump blood and may cause the arteries to become narrow or stiff. Understanding blood pressure readings A blood pressure reading includes a higher number over a lower number: The first, or top, number is called the systolic pressure. It is a measure of the pressure in your arteries as your heart beats. The second, or bottom number, is called the diastolic pressure. It is a measure of the pressure in your arteries as the heart relaxes. For most people, a normal blood pressure is below 120/80. Your personal target blood pressure may vary depending on your medical conditions, your age, and other factors. Blood pressure is classified into four stages. Based on your blood pressure reading, your health care provider may use the following stages to determine what type of treatment you need, if any. Systolic pressure and diastolic pressure are measured  in a unit called millimeters of mercury (mmHg). Normal Systolic pressure: below 120. Diastolic pressure: below 80. Elevated Systolic pressure: 120-129. Diastolic pressure: below 80. Hypertension stage 1 Systolic pressure: 130-139. Diastolic pressure: 80-89. Hypertension stage 2 Systolic pressure: 140 or above. Diastolic pressure: 90 or above. How can this condition affect me? Managing your hypertension is very important. Over time, hypertension can damage the arteries and decrease blood flow to parts of the body, including the brain, heart, and kidneys. Having untreated or uncontrolled hypertension can lead to: A heart attack. A stroke. A weakened blood vessel (aneurysm). Heart failure. Kidney damage. Eye damage. Memory and concentration problems. Vascular dementia. What actions can I take to manage this condition? Hypertension can be managed by making lifestyle changes and possibly by taking medicines. Your health care provider will help you make a plan to bring your blood pressure within a normal range. You may be referred for counseling on a healthy diet and physical activity. Nutrition  Eat a diet that is high in fiber and potassium, and low in salt (sodium), added sugar, and fat. An example eating plan is called the DASH diet. DASH stands for Dietary Approaches to Stop Hypertension. To eat this way: Eat plenty of fresh fruits and vegetables. Try to fill one-half of your plate at each meal with fruits and vegetables. Eat whole grains, such as whole-wheat pasta, brown rice, or whole-grain bread. Fill about one-fourth of your plate with whole grains. Eat low-fat dairy products. Avoid fatty cuts of meat, processed or cured meats, and poultry with skin. Fill about one-fourth of your plate with lean proteins such as fish, chicken without skin, beans, eggs, and tofu. Avoid pre-made and processed foods. These tend to be higher in sodium, added sugar, and fat. Reduce your daily sodium  intake. Many people with hypertension should eat less than 1,500 mg of sodium a day. Lifestyle  Work with your health care provider to maintain a healthy body weight or to  lose weight. Ask what an ideal weight is for you. Get at least 30 minutes of exercise that causes your heart to beat faster (aerobic exercise) most days of the week. Activities may include walking, swimming, or biking. Include exercise to strengthen your muscles (resistance exercise), such as weight lifting, as part of your weekly exercise routine. Try to do these types of exercises for 30 minutes at least 3 days a week. Do not use any products that contain nicotine or tobacco. These products include cigarettes, chewing tobacco, and vaping devices, such as e-cigarettes. If you need help quitting, ask your health care provider. Control any long-term (chronic) conditions you have, such as high cholesterol or diabetes. Identify your sources of stress and find ways to manage stress. This may include meditation, deep breathing, or making time for fun activities. Alcohol use Do not drink alcohol if: Your health care provider tells you not to drink. You are pregnant, may be pregnant, or are planning to become pregnant. If you drink alcohol: Limit how much you have to: 0-1 drink a day for women. 0-2 drinks a day for men. Know how much alcohol is in your drink. In the [place] one drink equals one 12 oz bottle of beer (355 mL), one 5 oz glass of wine (148 mL), or one 1 oz glass of hard liquor (44 mL). Medicines Your health care provider may prescribe medicine if lifestyle changes are not enough to get your blood pressure under control and if: Your systolic blood pressure is 130 or higher. Your diastolic blood pressure is 80 or higher. Take medicines only as told by your health care provider. Follow the directions carefully. Blood pressure medicines must be taken as told by your health care provider. The medicine does not work as well  when you skip doses. Skipping doses also puts you at risk for problems. Monitoring Before you monitor your blood pressure: Do not smoke, drink caffeinated beverages, or exercise within 30 minutes before taking a measurement. Use the bathroom and empty your bladder (urinate). Sit quietly for at least 5 minutes before taking measurements. Monitor your blood pressure at home as told by your health care provider. To do this: Sit with your back straight and supported. Place your feet flat on the floor. Do not cross your legs. Support your arm on a flat surface, such as a table. Make sure your upper arm is at heart level. Each time you measure, take two or three readings one minute apart and record the results. You may also need to have your blood pressure checked regularly by your health care provider. General information Talk with your health care provider about your diet, exercise habits, and other lifestyle factors that may be contributing to hypertension. Review all the medicines you take with your health care provider because there may be side effects or interactions. Keep all follow-up visits. Your health care provider can help you create and adjust your plan for managing your high blood pressure. Where to find more information National Heart, Lung, and Blood Institute: PopSte[employer].is American Heart Association: www.heart.org Contact a health care provider if: You think you are having a reaction to medicines you have taken. You have repeated (recurrent) headaches. You feel dizzy. You have swelling in your ankles. You have trouble with your vision. Get help right away if: You develop a severe headache or confusion. You have unusual weakness or numbness, or you feel faint. You have severe pain in your chest or abdomen. You vomit repeatedly. You have trouble  breathing. These symptoms may be an emergency. Get help right away. Call 911. Do not wait to see if the symptoms will go  away. Do not drive yourself to the hospital. Summary Hypertension is when the force of blood pumping through your arteries is too strong. If this condition is not controlled, it may put you at risk for serious complications. Your personal target blood pressure may vary depending on your medical conditions, your age, and other factors. For most people, a normal blood pressure is less than 120/80. Hypertension is managed by lifestyle changes, medicines, or both. Lifestyle changes to help manage hypertension include losing weight, eating a healthy, low-sodium diet, exercising more, stopping smoking, and limiting alcohol. This information is not intended to replace advice given to you by your health care provider. Make sure you discuss any questions you have with your health care provider. Document Revised: 02/07/2021 Document Reviewed: 02/07/2021 Elsevier Patient Education  2024 [employer]     Signed,   Meredith Staggers, MD [facility], [facility] 01/05/23 11:27 AM

## 2023-01-05 NOTE — Patient Instructions (Signed)
No change in medications for now.  Blood pressure is borderline but if you are able to cut back a little bit on the diet that will improve those numbers effectively.  See handout on foods to avoid as well as information below.  If any concerns on labs I will let you know.  Cologuard was ordered.  Thank you for coming today and I will see you in 6 months.  Managing Your Hypertension Hypertension, also called high blood pressure, is when the force of the blood pressing against the walls of the arteries is too strong. Arteries are blood vessels that carry blood from your heart throughout your body. Hypertension forces the heart to work harder to pump blood and may cause the arteries to become narrow or stiff. Understanding blood pressure readings A blood pressure reading includes a higher number over a lower number: The first, or top, number is called the systolic pressure. It is a measure of the pressure in your arteries as your heart beats. The second, or bottom number, is called the diastolic pressure. It is a measure of the pressure in your arteries as the heart relaxes. For most people, a normal blood pressure is below 120/80. Your personal target blood pressure may vary depending on your medical conditions, your age, and other factors. Blood pressure is classified into four stages. Based on your blood pressure reading, your health care provider may use the following stages to determine what type of treatment you need, if any. Systolic pressure and diastolic pressure are measured in a unit called millimeters of mercury (mmHg). Normal Systolic pressure: below 120. Diastolic pressure: below 80. Elevated Systolic pressure: 120-129. Diastolic pressure: below 80. Hypertension stage 1 Systolic pressure: 130-139. Diastolic pressure: 80-89. Hypertension stage 2 Systolic pressure: 140 or above. Diastolic pressure: 90 or above. How can this condition affect me? Managing your hypertension is very  important. Over time, hypertension can damage the arteries and decrease blood flow to parts of the body, including the brain, heart, and kidneys. Having untreated or uncontrolled hypertension can lead to: A heart attack. A stroke. A weakened blood vessel (aneurysm). Heart failure. Kidney damage. Eye damage. Memory and concentration problems. Vascular dementia. What actions can I take to manage this condition? Hypertension can be managed by making lifestyle changes and possibly by taking medicines. Your health care provider will help you make a plan to bring your blood pressure within a normal range. You may be referred for counseling on a healthy diet and physical activity. Nutrition  Eat a diet that is high in fiber and potassium, and low in salt (sodium), added sugar, and fat. An example eating plan is called the DASH diet. DASH stands for Dietary Approaches to Stop Hypertension. To eat this way: Eat plenty of fresh fruits and vegetables. Try to fill one-half of your plate at each meal with fruits and vegetables. Eat whole grains, such as whole-wheat pasta, brown rice, or whole-grain bread. Fill about one-fourth of your plate with whole grains. Eat low-fat dairy products. Avoid fatty cuts of meat, processed or cured meats, and poultry with skin. Fill about one-fourth of your plate with lean proteins such as fish, chicken without skin, beans, eggs, and tofu. Avoid pre-made and processed foods. These tend to be higher in sodium, added sugar, and fat. Reduce your daily sodium intake. Many people with hypertension should eat less than 1,500 mg of sodium a day. Lifestyle  Work with your health care provider to maintain a healthy body weight or to lose  weight. Ask what an ideal weight is for you. Get at least 30 minutes of exercise that causes your heart to beat faster (aerobic exercise) most days of the week. Activities may include walking, swimming, or biking. Include exercise to strengthen  your muscles (resistance exercise), such as weight lifting, as part of your weekly exercise routine. Try to do these types of exercises for 30 minutes at least 3 days a week. Do not use any products that contain nicotine or tobacco. These products include cigarettes, chewing tobacco, and vaping devices, such as e-cigarettes. If you need help quitting, ask your health care provider. Control any long-term (chronic) conditions you have, such as high cholesterol or diabetes. Identify your sources of stress and find ways to manage stress. This may include meditation, deep breathing, or making time for fun activities. Alcohol use Do not drink alcohol if: Your health care provider tells you not to drink. You are pregnant, may be pregnant, or are planning to become pregnant. If you drink alcohol: Limit how much you have to: 0-1 drink a day for women. 0-2 drinks a day for men. Know how much alcohol is in your drink. In the [place] one drink equals one 12 oz bottle of beer (355 mL), one 5 oz glass of wine (148 mL), or one 1 oz glass of hard liquor (44 mL). Medicines Your health care provider may prescribe medicine if lifestyle changes are not enough to get your blood pressure under control and if: Your systolic blood pressure is 130 or higher. Your diastolic blood pressure is 80 or higher. Take medicines only as told by your health care provider. Follow the directions carefully. Blood pressure medicines must be taken as told by your health care provider. The medicine does not work as well when you skip doses. Skipping doses also puts you at risk for problems. Monitoring Before you monitor your blood pressure: Do not smoke, drink caffeinated beverages, or exercise within 30 minutes before taking a measurement. Use the bathroom and empty your bladder (urinate). Sit quietly for at least 5 minutes before taking measurements. Monitor your blood pressure at home as told by your health care provider. To do  this: Sit with your back straight and supported. Place your feet flat on the floor. Do not cross your legs. Support your arm on a flat surface, such as a table. Make sure your upper arm is at heart level. Each time you measure, take two or three readings one minute apart and record the results. You may also need to have your blood pressure checked regularly by your health care provider. General information Talk with your health care provider about your diet, exercise habits, and other lifestyle factors that may be contributing to hypertension. Review all the medicines you take with your health care provider because there may be side effects or interactions. Keep all follow-up visits. Your health care provider can help you create and adjust your plan for managing your high blood pressure. Where to find more information National Heart, Lung, and Blood Institute: PopSte[employer].is American Heart Association: www.heart.org Contact a health care provider if: You think you are having a reaction to medicines you have taken. You have repeated (recurrent) headaches. You feel dizzy. You have swelling in your ankles. You have trouble with your vision. Get help right away if: You develop a severe headache or confusion. You have unusual weakness or numbness, or you feel faint. You have severe pain in your chest or abdomen. You vomit repeatedly. You have trouble  breathing. These symptoms may be an emergency. Get help right away. Call 911. Do not wait to see if the symptoms will go away. Do not drive yourself to the hospital. Summary Hypertension is when the force of blood pumping through your arteries is too strong. If this condition is not controlled, it may put you at risk for serious complications. Your personal target blood pressure may vary depending on your medical conditions, your age, and other factors. For most people, a normal blood pressure is less than 120/80. Hypertension is managed by  lifestyle changes, medicines, or both. Lifestyle changes to help manage hypertension include losing weight, eating a healthy, low-sodium diet, exercising more, stopping smoking, and limiting alcohol. This information is not intended to replace advice given to you by your health care provider. Make sure you discuss any questions you have with your health care provider. Document Revised: 02/07/2021 Document Reviewed: 02/07/2021 Elsevier Patient Education  2024 [employer].

## 2023-01-09 ENCOUNTER — Ambulatory Visit (INDEPENDENT_AMBULATORY_CARE_PROVIDER_SITE_OTHER): Payer: 59

## 2023-01-09 DIAGNOSIS — L501 Idiopathic urticaria: Secondary | ICD-10-CM

## 2023-01-30 ENCOUNTER — Ambulatory Visit: Payer: 59 | Admitting: *Deleted

## 2023-01-30 DIAGNOSIS — L501 Idiopathic urticaria: Secondary | ICD-10-CM | POA: Diagnosis not present

## 2023-02-23 ENCOUNTER — Ambulatory Visit (INDEPENDENT_AMBULATORY_CARE_PROVIDER_SITE_OTHER): Payer: 59

## 2023-02-23 DIAGNOSIS — L501 Idiopathic urticaria: Secondary | ICD-10-CM | POA: Diagnosis not present

## 2023-03-04 ENCOUNTER — Other Ambulatory Visit: Payer: Self-pay | Admitting: Allergy

## 2023-03-16 ENCOUNTER — Ambulatory Visit (INDEPENDENT_AMBULATORY_CARE_PROVIDER_SITE_OTHER): Payer: 59

## 2023-03-16 DIAGNOSIS — L501 Idiopathic urticaria: Secondary | ICD-10-CM

## 2023-03-29 NOTE — Progress Notes (Unsigned)
Follow Up Note  RE: Ashley Turner MRN: [id number] DOB: 1970/10/24 Date of Office Visit: 03/30/2023  Referring provider: Shade Flood, MD Primary care provider: Shade Flood, MD  Chief Complaint: Urticaria (Some flares - xolair renewal / prior authorization needed )  History of Present Illness: I had the pleasure of seeing Ashley Turner for a follow up visit at the [facility] on 03/30/2023. She is a 52 y.o. female, who is being followed for CIU on Xolair 300 mg Q3 weeks. Her previous allergy office visit was on 09/29/2022 with Dr. Selena Turner. Today is a regular follow up visit.  Discussed the use of AI scribe software for clinical note transcription with the patient, who gave verbal consent to proceed.  The patient, currently on Xolair therapy, presents for a routine visit to renew paperwork for the medication. She receives Xolair 300mg  injections every three weeks, which has been effective in managing her symptoms. However, she reports occasional hives, occurring once or twice every couple of weeks. She attributes these episodes to an irregular medication schedule due to a demanding work routine, causing her to take her medication three hours later than usual.  Despite the occasional hives, the patient reports that this regimen has been effective.   Assessment and Plan: Ashley Turner is a 52 y.o. female with: Chronic idiopathic urticaria Past history - urticaria, pruritus and episodes of angioedema. No triggers. Labwork unremarkable. Unable to wean off Xolair.  Interim history - Patient reports occasional hives, possibly related to inconsistent timing of antihistamine use. Content with current regimen. Move Xolair 300mg  injections to every 4 weeks.  You may try to take allegra, zyrtec or Xyzal twice a day. No more than 4 pills total.  If symptoms are not controlled or causes drowsiness let us know. Continue Pepcid (famotidine) 20mg  twice a day.  Continue Singulair  (montelukast) 10mg  daily at night. Continue proper skin care.  Avoid the following potential triggers: alcohol, tight clothing, NSAIDs, hot showers and getting overheated. Sent in Rx for Auvi-Q, if not covered let us know.   Return in about 6 months (around 09/28/2023).  Meds ordered this encounter  Medications   EPINEPHrine (AUVI-Q) 0.3 mg/0.3 mL IJ SOAJ injection    Sig: Inject 0.3 mg into the muscle as needed for anaphylaxis.    Dispense:  2 each    Refill:  1    [phone]   Lab Orders  No laboratory test(s) ordered today    Diagnostics: None.   Medication List:  Current Outpatient Medications  Medication Sig Dispense Refill   amLODipine (NORVASC) 10 MG tablet Take 1 tablet (10 mg total) by mouth daily. 90 tablet 2   CALCIUM PO Take 1 tablet by mouth daily.     cetirizine (ZYRTEC) 10 MG tablet Take 20 mg by mouth daily. Pt reports 20 mg am and Xyzal in PM     Cholecalciferol (VITAMIN D PO) Take 1 tablet by mouth daily.     EPINEPHrine (AUVI-Q) 0.3 mg/0.3 mL IJ SOAJ injection Inject 0.3 mg into the muscle as needed for anaphylaxis. 2 each 1   famotidine (PEPCID) 20 MG tablet Take 20 mg by mouth 2 (two) times daily.     Ashley Turner 1-20 MG-MCG(24) tablet Take 1 tablet by mouth daily.     levocetirizine (XYZAL) 5 MG tablet Take 10 mg by mouth every evening.     montelukast (SINGULAIR) 10 MG tablet TAKE 1 TABLET BY MOUTH EVERYDAY AT BEDTIME 30 tablet  4   Multiple Vitamin (MULTIVITAMIN WITH MINERALS) TABS tablet Take 1 tablet by mouth daily.     XOLAIR 150 MG injection INJECT 300MG  SUBCUTANEOUSLY  EVERY 2 WEEKS 4 each 11   fexofenadine (ALLEGRA) 180 MG tablet Take 180 mg by mouth daily. (Patient not taking: Reported on 03/30/2023)     Current Facility-Administered Medications  Medication Dose Route Frequency Provider Last Rate Last Admin   omalizumab Ashley Turner) injection 300 mg  300 mg Subcutaneous Q28 days Ashley Sia, DO   300 mg at 03/16/23 1610   Allergies: Allergies   Allergen Reactions   Codeine Nausea And Vomiting   I reviewed her past medical history, social history, family history, and environmental history and no significant changes have been reported from her previous visit.  Review of Systems  Constitutional:  Negative for appetite change, chills, fever and unexpected weight change.  HENT:  Negative for congestion and rhinorrhea.   Eyes:  Negative for itching.  Respiratory:  Negative for cough, chest tightness, shortness of breath and wheezing.   Cardiovascular:  Negative for chest pain.  Gastrointestinal:  Negative for abdominal pain.  Genitourinary:  Negative for difficulty urinating.  Skin:  Negative for rash.  Allergic/Immunologic: Negative for environmental allergies and food allergies.  Neurological:  Negative for headaches.    Objective: BP 136/88   Pulse 82   Temp 98.4 F (36.9 C)   Resp 18   Ht 5' 3.39" (1.61 m)   Wt 214 lb 12.8 oz (97.4 kg)   SpO2 96%   BMI 37.59 kg/m  Body mass index is 37.59 kg/m. Physical Exam Vitals and nursing note reviewed.  Constitutional:      Appearance: Normal appearance. She is well-developed.  HENT:     Head: Normocephalic and atraumatic.     Right Ear: External ear normal.     Left Ear: External ear normal.     Ears:     Comments: Scarring in TM b/l.    Nose: Nose normal.     Mouth/Throat:     Mouth: Mucous membranes are moist.     Pharynx: Oropharynx is clear.  Eyes:     Conjunctiva/sclera: Conjunctivae normal.  Cardiovascular:     Rate and Rhythm: Normal rate and regular rhythm.     Heart sounds: Normal heart sounds. No murmur heard.    No friction rub. No gallop.  Pulmonary:     Effort: Pulmonary effort is normal.     Breath sounds: Normal breath sounds. No wheezing or rales.  Musculoskeletal:     Cervical back: Neck supple.  Skin:    General: Skin is warm.     Findings: No rash.  Neurological:     Mental Status: She is alert and oriented to person, place, and time.   Psychiatric:        Behavior: Behavior normal.    Previous notes and tests were reviewed. The plan was reviewed with the patient/family, and all questions/concerned were addressed.  It was my pleasure to see Ashley Turner today and participate in her care. Please feel free to contact me with any questions or concerns.  Sincerely,  Wyline Mood, DO Allergy & Immunology  [facility] office: [phone] [facility] office: [phone]

## 2023-03-30 ENCOUNTER — Other Ambulatory Visit: Payer: Self-pay

## 2023-03-30 ENCOUNTER — Encounter: Payer: Self-pay | Admitting: Allergy

## 2023-03-30 ENCOUNTER — Ambulatory Visit: Payer: 59 | Admitting: Allergy

## 2023-03-30 VITALS — BP 136/88 | HR 82 | Temp 98.4°F | Resp 18 | Ht 63.39 in | Wt 214.8 lb

## 2023-03-30 DIAGNOSIS — L501 Idiopathic urticaria: Secondary | ICD-10-CM | POA: Diagnosis not present

## 2023-03-30 MED ORDER — EPINEPHRINE 0.3 MG/0.3ML IJ SOAJ: 0.3000 mg | INTRAMUSCULAR | 1 refills | Status: AC | PRN

## 2023-03-30 NOTE — Patient Instructions (Addendum)
Urticaria Move Xolair 300mg  injections to every 4 weeks.  Will renew paperwork.   You may try to take allegra, zyrtec or Xyzal twice a day. No more than 4 pills total.  If symptoms are not controlled or causes drowsiness let us know. Continue Pepcid (famotidine) 20mg  twice a day.  Continue Singulair (montelukast) 10mg  daily at night. Continue proper skin care.  Avoid the following potential triggers: alcohol, tight clothing, NSAIDs, hot showers and getting overheated. Sent in Rx for Auvi-Q, if not covered let us know.   Follow up in 6 months or sooner if needed.   Skin care recommendations  Bath time: Always use lukewarm water. AVOID very hot or cold water. Keep bathing time to 5-10 minutes. Do NOT use bubble bath. Use a mild soap and use just enough to wash the dirty areas. Do NOT scrub skin vigorously.  After bathing, pat dry your skin with a towel. Do NOT rub or scrub the skin.  Moisturizers and prescriptions:  ALWAYS apply moisturizers immediately after bathing (within 3 minutes). This helps to lock-in moisture. Use the moisturizer several times a day over the whole body. Good summer moisturizers include: Aveeno, CeraVe, Cetaphil. Good winter moisturizers include: Aquaphor, Vaseline, Cerave, Cetaphil, Eucerin, Vanicream. When using moisturizers along with medications, the moisturizer should be applied about one hour after applying the medication to prevent diluting effect of the medication or moisturize around where you applied the medications. When not using medications, the moisturizer can be continued twice daily as maintenance.  Laundry and clothing: Avoid laundry products with added color or perfumes. Use unscented hypo-allergenic laundry products such as Tide free, Cheer free & gentle, and All free and clear.  If the skin still seems dry or sensitive, you can try double-rinsing the clothes. Avoid tight or scratchy clothing such as wool. Do not use fabric softeners or  dyer sheets.

## 2023-03-31 ENCOUNTER — Other Ambulatory Visit: Payer: Self-pay | Admitting: *Deleted

## 2023-03-31 MED ORDER — OMALIZUMAB 150 MG/ML ~~LOC~~ SOSY: 300.0000 mg | PREFILLED_SYRINGE | SUBCUTANEOUS | 11 refills | Status: DC

## 2023-04-06 ENCOUNTER — Ambulatory Visit (INDEPENDENT_AMBULATORY_CARE_PROVIDER_SITE_OTHER): Payer: 59

## 2023-04-06 DIAGNOSIS — L501 Idiopathic urticaria: Secondary | ICD-10-CM | POA: Diagnosis not present

## 2023-05-01 ENCOUNTER — Encounter: Payer: Self-pay | Admitting: Family Medicine

## 2023-05-04 ENCOUNTER — Ambulatory Visit (INDEPENDENT_AMBULATORY_CARE_PROVIDER_SITE_OTHER): Payer: 59

## 2023-05-04 DIAGNOSIS — L501 Idiopathic urticaria: Secondary | ICD-10-CM

## 2023-05-19 LAB — COLOGUARD: COLOGUARD: NEGATIVE

## 2023-06-01 ENCOUNTER — Ambulatory Visit (INDEPENDENT_AMBULATORY_CARE_PROVIDER_SITE_OTHER): Payer: 59

## 2023-06-01 DIAGNOSIS — L501 Idiopathic urticaria: Secondary | ICD-10-CM | POA: Diagnosis not present

## 2023-06-29 ENCOUNTER — Ambulatory Visit: Payer: 59

## 2023-07-02 ENCOUNTER — Ambulatory Visit (INDEPENDENT_AMBULATORY_CARE_PROVIDER_SITE_OTHER): Payer: 59 | Admitting: *Deleted

## 2023-07-02 DIAGNOSIS — L501 Idiopathic urticaria: Secondary | ICD-10-CM

## 2023-07-10 ENCOUNTER — Encounter: Payer: 59 | Admitting: Family Medicine

## 2023-07-30 ENCOUNTER — Other Ambulatory Visit: Payer: Self-pay | Admitting: Allergy

## 2023-07-31 ENCOUNTER — Ambulatory Visit: Payer: 59

## 2023-07-31 DIAGNOSIS — L501 Idiopathic urticaria: Secondary | ICD-10-CM

## 2023-08-27 ENCOUNTER — Encounter: Payer: Self-pay | Admitting: Allergy

## 2023-08-27 NOTE — Telephone Encounter (Signed)
 Tammy,  Can we see if I can move her Xolair to 300mg  every 3 weeks due to breakthrough hives?  Thank you.

## 2023-08-28 ENCOUNTER — Ambulatory Visit: Payer: 59

## 2023-08-28 DIAGNOSIS — L501 Idiopathic urticaria: Secondary | ICD-10-CM

## 2023-09-08 ENCOUNTER — Telehealth: Payer: Self-pay | Admitting: *Deleted

## 2023-09-08 MED ORDER — XOLAIR 300 MG/2ML ~~LOC~~ SOSY: 300.0000 mg | PREFILLED_SYRINGE | SUBCUTANEOUS | 11 refills | Status: DC

## 2023-09-08 NOTE — Telephone Encounter (Signed)
 Called patient and advised dose increase and rx to [facility] for XOlair 300mg  every 3 weeks

## 2023-09-21 ENCOUNTER — Ambulatory Visit

## 2023-09-21 DIAGNOSIS — L501 Idiopathic urticaria: Secondary | ICD-10-CM

## 2023-09-21 MED ORDER — OMALIZUMAB 300 MG/2  ML ~~LOC~~ SOSY
300.0000 mg | PREFILLED_SYRINGE | SUBCUTANEOUS | Status: DC
  Administered 2023-09-21 – 2024-04-01 (×10): 300 mg via SUBCUTANEOUS

## 2023-09-28 ENCOUNTER — Ambulatory Visit: Payer: 59 | Admitting: Allergy

## 2023-09-28 ENCOUNTER — Ambulatory Visit

## 2023-10-12 ENCOUNTER — Ambulatory Visit (INDEPENDENT_AMBULATORY_CARE_PROVIDER_SITE_OTHER)

## 2023-10-12 DIAGNOSIS — L501 Idiopathic urticaria: Secondary | ICD-10-CM

## 2023-10-19 ENCOUNTER — Ambulatory Visit

## 2023-10-25 NOTE — Progress Notes (Signed)
 Follow Up Note  RE: Ashley Turner MRN: [id number] DOB: [birth year] Date of Office Visit: 10/26/2023  Referring provider: Benjiman Bras, MD Primary care provider: Benjiman Bras, MD  Chief Complaint: Urticaria (Currently has hives has not cleared for 6 months now ) and Angioedema (No issues )  History of Present Illness: I had the pleasure of seeing Ashley Turner for a follow up visit at the [facility] on 10/26/2023. She is a 53 y.o. female, who is being followed for CIU on Xolair . Her previous allergy  office visit was on 03/30/2023 with Dr. Burdette Carolin. Today is a regular follow up visit.  Discussed the use of AI scribe software for clinical note transcription with the patient, who gave verbal consent to proceed.    She has been experiencing persistent hives for the past six months, occurring almost daily. The hives are present throughout the day and are worse in the mornings. She experiences itching that sometimes wakes her up and makes it difficult to return to sleep. Heat can exacerbate her symptoms, and she experiences itching even when no visible hives are present.  She is currently on a regimen of two Zyrtec  and a Pepcid  in the morning, and Pepcid , Xyzal, and Singulair  in the evening. Despite this, she continues to experience symptoms. She has been receiving Xolair  injections, initially every four weeks, but recently increased to every three weeks. Previously, she was able to extend the interval between injections to five weeks, but symptoms returned before reaching six weeks. She has been on her current treatment regimen for approximately four to five years.   She wants to continue with new Xolair  dosing before switching to a different medication.     Assessment and Plan: Ashley Turner is a 53 y.o. female with: Chronic idiopathic urticaria Past history - urticaria, pruritus and episodes of angioedema. No triggers. Labwork unremarkable. Unable to wean off Xolair .  Interim  history - initially had good response to Xolair  but now having daily symptoms x 6 months. Recently changed to Xolair  300mg  every 3 weeks. Continue Xolair  300mg  injections 3 weeks.  You may try to take allegra, zyrtec  or Xyzal twice a day. No more than 4 pills total.  If symptoms are not controlled or causes drowsiness let us  know. Continue Pepcid  (famotidine ) 20mg  twice a day.  Continue Singulair  (montelukast ) 10mg  daily at night. Continue proper skin care.  Avoid the following potential triggers: alcohol, tight clothing, NSAIDs, hot showers and getting overheated. Consider switching to Dupixent if Xolair  ineffective in 2 months.  Return in about 2 months (around 12/26/2023).  No orders of the defined types were placed in this encounter.  Lab Orders  No laboratory test(s) ordered today    Diagnostics: None.   Medication List:  Current Outpatient Medications  Medication Sig Dispense Refill   amLODipine  (NORVASC ) 10 MG tablet Take 1 tablet (10 mg total) by mouth daily. 90 tablet 2   CALCIUM PO Take 1 tablet by mouth daily.     cetirizine  (ZYRTEC ) 10 MG tablet Take 20 mg by mouth daily. Pt reports 20 mg am and Xyzal in PM     Cholecalciferol (VITAMIN D PO) Take 1 tablet by mouth daily.     EPINEPHrine  (AUVI-Q ) 0.3 mg/0.3 mL IJ SOAJ injection Inject 0.3 mg into the muscle as needed for anaphylaxis. 2 each 1   famotidine  (PEPCID ) 20 MG tablet Take 20 mg by mouth 2 (two) times daily.     fexofenadine (ALLEGRA) 180 MG tablet  Take 180 mg by mouth daily.     HAILEY 24 FE 1-20 MG-MCG(24) tablet Take 1 tablet by mouth daily.     levocetirizine (XYZAL) 5 MG tablet Take 10 mg by mouth every evening.     montelukast  (SINGULAIR ) 10 MG tablet TAKE 1 TABLET BY MOUTH EVERYDAY AT BEDTIME 30 tablet 2   Multiple Vitamin (MULTIVITAMIN WITH MINERALS) TABS tablet Take 1 tablet by mouth daily.     omalizumab  (XOLAIR ) 300 MG/2  ML prefilled syringe Inject 300 mg into the skin every 14 (fourteen) days. 4 mL  11   Current Facility-Administered Medications  Medication Dose Route Frequency Provider Last Rate Last Admin   omalizumab  (XOLAIR ) injection 300 mg  300 mg Subcutaneous Q28 days Trudy Fusi, DO   300 mg at 08/28/23 1610   omalizumab  (XOLAIR ) prefilled syringe 300 mg  300 mg Subcutaneous Q28 days Trudy Fusi, DO   300 mg at 10/12/23 1341   Allergies: Allergies  Allergen Reactions   Codeine Nausea And Vomiting   I reviewed her past medical history, social history, family history, and environmental history and no significant changes have been reported from her previous visit.  Review of Systems  Constitutional:  Negative for appetite change, chills, fever and unexpected weight change.  HENT:  Negative for congestion and rhinorrhea.   Eyes:  Negative for itching.  Respiratory:  Negative for cough, chest tightness, shortness of breath and wheezing.   Cardiovascular:  Negative for chest pain.  Gastrointestinal:  Negative for abdominal pain.  Genitourinary:  Negative for difficulty urinating.  Skin:  Positive for rash.  Allergic/Immunologic: Negative for environmental allergies and food allergies.  Neurological:  Negative for headaches.    Objective: BP 130/76 (BP Location: Right Arm, Patient Position: Sitting, Cuff Size: Normal)   Pulse 93   Temp 98.8 F (37.1 C) (Temporal)   Resp 18   Ht 5' 2.99" (1.6 m)   Wt 220 lb 12.8 oz (100.2 kg)   SpO2 95%   BMI 39.12 kg/m  Body mass index is 39.12 kg/m. Physical Exam Vitals and nursing note reviewed.  Constitutional:      Appearance: Normal appearance. She is well-developed.  HENT:     Head: Normocephalic and atraumatic.     Right Ear: External ear normal.     Left Ear: External ear normal.     Ears:     Comments: Scarring in TM b/l.    Nose: Nose normal.     Mouth/Throat:     Mouth: Mucous membranes are moist.     Pharynx: Oropharynx is clear.  Eyes:     Conjunctiva/sclera: Conjunctivae normal.  Cardiovascular:     Rate  and Rhythm: Normal rate and regular rhythm.     Heart sounds: Normal heart sounds. No murmur heard.    No friction rub. No gallop.  Pulmonary:     Effort: Pulmonary effort is normal.     Breath sounds: Normal breath sounds. No wheezing or rales.  Musculoskeletal:     Cervical back: Neck supple.  Skin:    General: Skin is warm.     Findings: Rash present.     Comments: Diffuse urticarial rash on upper and lower extremities b/l.   Neurological:     Mental Status: She is alert and oriented to person, place, and time.  Psychiatric:        Behavior: Behavior normal.   Previous notes and tests were reviewed. The plan was reviewed with the patient/family, and all  questions/concerned were addressed.  It was my pleasure to see Ashley Turner today and participate in her care. Please feel free to contact me with any questions or concerns.  Sincerely,  Eudelia Hero, DO Allergy  & Immunology  [facility]  [place] office: [phone] [facility] office: [phone]

## 2023-10-26 ENCOUNTER — Encounter: Payer: Self-pay | Admitting: Allergy

## 2023-10-26 ENCOUNTER — Ambulatory Visit: Admitting: Allergy

## 2023-10-26 ENCOUNTER — Other Ambulatory Visit: Payer: Self-pay

## 2023-10-26 VITALS — BP 130/76 | HR 93 | Temp 98.8°F | Resp 18 | Ht 62.99 in | Wt 220.8 lb

## 2023-10-26 DIAGNOSIS — L501 Idiopathic urticaria: Secondary | ICD-10-CM | POA: Diagnosis not present

## 2023-10-26 NOTE — Patient Instructions (Addendum)
 Urticaria Continue Xolair  300mg  injections 3 weeks.   You may try to take allegra, zyrtec  or Xyzal twice a day. No more than 4 pills total.  If symptoms are not controlled or causes drowsiness let us  know. Continue Pepcid  (famotidine ) 20mg  twice a day.  Continue Singulair  (montelukast ) 10mg  daily at night. Continue proper skin care.  Avoid the following potential triggers: alcohol, tight clothing, NSAIDs, hot showers and getting overheated.  Consider switching to Dupixent if Xolair  does not help anymore.   Follow up in 2 months or sooner if needed.

## 2023-10-31 ENCOUNTER — Other Ambulatory Visit: Payer: Self-pay | Admitting: Allergy

## 2023-11-04 ENCOUNTER — Ambulatory Visit (INDEPENDENT_AMBULATORY_CARE_PROVIDER_SITE_OTHER)

## 2023-11-04 DIAGNOSIS — L501 Idiopathic urticaria: Secondary | ICD-10-CM

## 2023-11-27 ENCOUNTER — Ambulatory Visit

## 2023-11-27 DIAGNOSIS — L501 Idiopathic urticaria: Secondary | ICD-10-CM | POA: Diagnosis not present

## 2023-12-18 ENCOUNTER — Ambulatory Visit

## 2023-12-18 ENCOUNTER — Telehealth: Payer: Self-pay

## 2023-12-18 DIAGNOSIS — L501 Idiopathic urticaria: Secondary | ICD-10-CM | POA: Diagnosis not present

## 2023-12-18 NOTE — Telephone Encounter (Signed)
 Patient's Xolair  will be shipped on 12/29/23 prior to the patient's 01/08/24 appointment.

## 2023-12-20 ENCOUNTER — Other Ambulatory Visit: Payer: Self-pay | Admitting: Family Medicine

## 2023-12-20 DIAGNOSIS — I10 Essential (primary) hypertension: Secondary | ICD-10-CM

## 2024-01-08 ENCOUNTER — Ambulatory Visit

## 2024-01-08 DIAGNOSIS — L501 Idiopathic urticaria: Secondary | ICD-10-CM | POA: Diagnosis not present

## 2024-01-29 ENCOUNTER — Ambulatory Visit

## 2024-01-29 DIAGNOSIS — L501 Idiopathic urticaria: Secondary | ICD-10-CM

## 2024-02-19 ENCOUNTER — Ambulatory Visit (INDEPENDENT_AMBULATORY_CARE_PROVIDER_SITE_OTHER)

## 2024-02-19 DIAGNOSIS — L501 Idiopathic urticaria: Secondary | ICD-10-CM | POA: Diagnosis not present

## 2024-03-11 ENCOUNTER — Ambulatory Visit (INDEPENDENT_AMBULATORY_CARE_PROVIDER_SITE_OTHER)

## 2024-03-11 DIAGNOSIS — L501 Idiopathic urticaria: Secondary | ICD-10-CM

## 2024-03-14 ENCOUNTER — Encounter: Payer: Self-pay | Admitting: Allergy

## 2024-04-01 ENCOUNTER — Ambulatory Visit (INDEPENDENT_AMBULATORY_CARE_PROVIDER_SITE_OTHER)

## 2024-04-01 DIAGNOSIS — L501 Idiopathic urticaria: Secondary | ICD-10-CM | POA: Diagnosis not present

## 2024-04-07 ENCOUNTER — Encounter: Payer: Self-pay | Admitting: Allergy

## 2024-04-07 MED ORDER — PREDNISONE 10 MG PO TABS: ORAL_TABLET | ORAL | 0 refills | Status: DC

## 2024-04-07 NOTE — Addendum Note (Signed)
 Addended by: Avonne Berkery M on: 04/07/2024 05:42 PM   Modules accepted: Orders

## 2024-04-13 ENCOUNTER — Other Ambulatory Visit: Payer: Self-pay

## 2024-04-13 ENCOUNTER — Ambulatory Visit: Admitting: Allergy

## 2024-04-13 ENCOUNTER — Encounter: Payer: Self-pay | Admitting: Allergy

## 2024-04-13 VITALS — BP 124/78 | HR 74 | Temp 98.3°F | Resp 18 | Ht 63.0 in | Wt 226.3 lb

## 2024-04-13 DIAGNOSIS — L501 Idiopathic urticaria: Secondary | ICD-10-CM | POA: Diagnosis not present

## 2024-04-13 NOTE — Patient Instructions (Addendum)
 Urticaria Stop Xolair . Start rhapsido 25mg  twice a day - sample given.  Once your lab results are back I'll let you know and then start the medication. Will start Prior Authorization and Tammy will be in touch with you. [email or web address]  Risk of Bleeding: Mucocutaneous-related bleeding occurred in 9% of patients who received RHAPSIDO. Interrupt treatment with RHAPSIDO if bleeding is observed and resume if the benefit is expected to outweigh the risk. Interrupt treatment with RHAPSIDO for 3 to 7 days pre- and post-surgery or invasive procedures. Use of antithrombotic agents concomitantly with RHAPSIDO may further increase the risk of bleeding. Consider the benefits and risks of antithrombotic agents when used with RHAPSIDO. Monitor for signs and symptoms of bleeding The use of live and live-attenuated vaccines should be avoided in patients receiving RHAPSIDO  You may try to take allegra, zyrtec  or Xyzal twice a day. No more than 4 pills total.  If symptoms are not controlled or causes drowsiness let us  know. Continue Pepcid  (famotidine ) 20mg  twice a day.  Continue Singulair  (montelukast ) 10mg  daily at night. Continue proper skin care.  Avoid the following potential triggers: alcohol, tight clothing, NSAIDs, hot showers and getting overheated.  Get bloodwork We are ordering labs, so please allow 1-2 weeks for the results to come back. With the newly implemented Cures Act, the labs might be visible to you at the same time that they become visible to me. However, I will not address the results until all of the results are back, so please be patient.  In the meantime, continue recommendations in your patient instructions, including avoidance measures (if applicable), until you hear from me.   Follow up in 2 months or sooner if needed.

## 2024-04-13 NOTE — Progress Notes (Signed)
 Follow Up Note  RE: Ashley Turner MRN: [id number] DOB: [birth year] Date of Office Visit: 04/13/2024  Referring provider: Levora Reyes SAUNDERS, MD Primary care provider: Levora Reyes SAUNDERS, MD  Chief Complaint: Follow-up and Urticaria (Have come back on a regular basis. It has been going on for about 8 weeks but the last two weeks have been the worst. )  History of Present Illness: I had the pleasure of seeing Ashley Turner for a follow up visit at the [facility] on 04/13/2024. She is a 53 y.o. female, who is being followed for CIU on Xolair . Her previous allergy  office visit was on 10/26/2023 with Dr. Luke. Today is a regular follow up visit.  Discussed the use of AI scribe software for clinical note transcription with the patient, who gave verbal consent to proceed.    She has been experiencing daily hives since October.. The hives occur all over her body. Prednisone  provided significant relief for one day only, but she continues to experience hives daily, albeit less severe.  Her current medication regimen includes Zyrtec  20 mg in the morning, Xyzal 20 mg in the evening, famotidine  20 mg twice daily, and Singulair  at night. There have been no changes in these medications since May. She takes amlodipine  for high blood pressure and denies any history of bleeding or use of blood thinners.  No recent changes in diet, medication, infections, surgeries, or increased stress beyond her usual levels. She has not had recent lab work done and denies any recent fevers, chills, or changes in appetite or bowel habits. She describes her current condition as better than it has been, with symptoms worsening at night. Breathing is reported as good.     Interested in starting Rhapsido. No recent labs.      Assessment and Plan: Ashley Turner is a 53 y.o. female with: Chronic idiopathic urticaria Past history - urticaria, pruritus and episodes of angioedema. No triggers. Labwork unremarkable. Unable  to wean off Xolair .  Interim history - having daily hives since October even with Xolair  300mg  every 3 week injection, zyrtec , Xyzal, Pepcid  and Singulair  regimen. Prednisone  provided some relief. Interested in starting Rhapsido. Stop Xolair . Start rhapsido 25mg  twice a day - sample given.  Once your lab results are back I'll let you know and then start the medication. Will start Prior Authorization and Tammy will be in touch with you. [email or web address] Discussed Risk of Bleeding: Mucocutaneous-related bleeding occurred in 9% of patients who received RHAPSIDO. Interrupt treatment with RHAPSIDO if bleeding is observed and resume if the benefit is expected to outweigh the risk. Interrupt treatment with RHAPSIDO for 3 to 7 days pre- and post-surgery or invasive procedures. Use of antithrombotic agents concomitantly with RHAPSIDO may further increase the risk of bleeding. Consider the benefits and risks of antithrombotic agents when used with RHAPSIDO. Monitor for signs and symptoms of bleeding. The use of live and live-attenuated vaccines should be avoided in patients receiving RHAPSIDO You may try to take allegra, zyrtec  or Xyzal twice a day. No more than 4 pills total.  If symptoms are not controlled or causes drowsiness let us  know. Continue Pepcid  (famotidine ) 20mg  twice a day.  Continue Singulair  (montelukast ) 10mg  daily at night. Continue proper skin care.  Avoid the following potential triggers: alcohol, tight clothing, NSAIDs, hot showers and getting overheated. Get bloodwork.  Return in about 2 months (around 06/13/2024).  No orders of the defined types were placed in this encounter.  Lab Orders  Chronic Urticaria (CU) Eval         Tryptase         Thyroid  Cascade Profile         Comprehensive metabolic panel with GFR         Alpha-Gal Panel         ANA, IFA (with reflex)      Diagnostics: None.   Medication List:  Current Outpatient Medications   Medication Sig Dispense Refill   amLODipine  (NORVASC ) 10 MG tablet TAKE 1 TABLET BY MOUTH EVERY DAY 90 tablet 2   CALCIUM PO Take 1 tablet by mouth daily.     cetirizine  (ZYRTEC ) 10 MG tablet Take 20 mg by mouth daily. Pt reports 20 mg am and Xyzal in PM     Cholecalciferol (VITAMIN D PO) Take 1 tablet by mouth daily.     EPINEPHrine  (AUVI-Q ) 0.3 mg/0.3 mL IJ SOAJ injection Inject 0.3 mg into the muscle as needed for anaphylaxis. 2 each 1   famotidine  (PEPCID ) 20 MG tablet Take 20 mg by mouth 2 (two) times daily.     fexofenadine (ALLEGRA) 180 MG tablet Take 180 mg by mouth daily.     HAILEY 24 FE 1-20 MG-MCG(24) tablet Take 1 tablet by mouth daily.     levocetirizine (XYZAL) 5 MG tablet Take 10 mg by mouth every evening.     montelukast  (SINGULAIR ) 10 MG tablet TAKE 1 TABLET BY MOUTH EVERYDAY AT BEDTIME 30 tablet 5   Multiple Vitamin (MULTIVITAMIN WITH MINERALS) TABS tablet Take 1 tablet by mouth daily.     omalizumab  (XOLAIR ) 300 MG/2  ML prefilled syringe Inject 300 mg into the skin every 14 (fourteen) days. 4 mL 11   Current Facility-Administered Medications  Medication Dose Route Frequency Provider Last Rate Last Admin   omalizumab  (XOLAIR ) injection 300 mg  300 mg Subcutaneous Q28 days Luke Orlan HERO, DO   300 mg at 08/28/23 9088   omalizumab  (XOLAIR ) prefilled syringe 300 mg  300 mg Subcutaneous Q28 days Luke Orlan HERO, DO   300 mg at 04/01/24 9143   Allergies: Allergies  Allergen Reactions   Codeine Nausea And Vomiting   I reviewed her past medical history, social history, family history, and environmental history and no significant changes have been reported from her previous visit.  Review of Systems  Constitutional:  Negative for appetite change, chills, fever and unexpected weight change.  HENT:  Negative for congestion and rhinorrhea.   Eyes:  Negative for itching.  Respiratory:  Negative for cough, chest tightness, shortness of breath and wheezing.   Cardiovascular:  Negative  for chest pain.  Gastrointestinal:  Negative for abdominal pain.  Genitourinary:  Negative for difficulty urinating.  Skin:  Positive for rash.  Allergic/Immunologic: Negative for environmental allergies and food allergies.  Neurological:  Negative for headaches.    Objective: BP 124/78 (BP Location: Right Arm, Patient Position: Sitting, Cuff Size: Normal)   Pulse 74   Temp 98.3 F (36.8 C) (Temporal)   Resp 18   Ht 5' 3 (1.6 m)   Wt 226 lb 4.8 oz (102.6 kg)   SpO2 97%   BMI 40.09 kg/m  Body mass index is 40.09 kg/m. Physical Exam Vitals and nursing note reviewed.  Constitutional:      Appearance: Normal appearance. She is well-developed.  HENT:     Head: Normocephalic and atraumatic.     Right Ear: External ear normal.     Left Ear: External ear normal.  Ears:     Comments: Scarring in TM b/l.    Nose: Nose normal.     Mouth/Throat:     Mouth: Mucous membranes are moist.     Pharynx: Oropharynx is clear.  Eyes:     Conjunctiva/sclera: Conjunctivae normal.  Cardiovascular:     Rate and Rhythm: Normal rate and regular rhythm.     Heart sounds: Normal heart sounds. No murmur heard.    No friction rub. No gallop.  Pulmonary:     Effort: Pulmonary effort is normal.     Breath sounds: Normal breath sounds. No wheezing or rales.  Musculoskeletal:     Cervical back: Neck supple.  Skin:    General: Skin is warm.     Findings: Rash present.     Comments: Diffuse blanchable erythematous patches on torso.  Neurological:     Mental Status: She is alert and oriented to person, place, and time.  Psychiatric:        Behavior: Behavior normal.    Previous notes and tests were reviewed. The plan was reviewed with the patient/family, and all questions/concerned were addressed.  It was my pleasure to see Ashley Turner today and participate in her care. Please feel free to contact me with any questions or concerns.  Sincerely,  Orlan Cramp, DO Allergy  & Immunology  [facility]  [place] office: [phone] [facility] office: [phone]

## 2024-04-14 ENCOUNTER — Ambulatory Visit: Payer: Self-pay | Admitting: Allergy

## 2024-04-17 LAB — COMPREHENSIVE METABOLIC PANEL WITH GFR
AST: 12 IU/L (ref 0–40)
Albumin: 4.3 g/dL (ref 3.8–4.9)
Alkaline Phosphatase: 47 IU/L — ABNORMAL LOW (ref 49–135)
BUN/Creatinine Ratio: 20 (ref 9–23)
BUN: 18 mg/dL (ref 6–24)
Bilirubin Total: 0.4 mg/dL (ref 0.0–1.2)
CO2: 20 mmol/L (ref 20–29)
Calcium: 9.8 mg/dL (ref 8.7–10.2)
Chloride: 102 mmol/L (ref 96–106)
Creatinine, Ser: 0.91 mg/dL (ref 0.57–1.00)
Globulin, Total: 2.5 g/dL (ref 1.5–4.5)
Glucose: 85 mg/dL (ref 70–99)
Potassium: 4.7 mmol/L (ref 3.5–5.2)
Sodium: 141 mmol/L (ref 134–144)
Total Protein: 6.8 g/dL (ref 6.0–8.5)
eGFR: 76 mL/min/1.73 (ref >59)

## 2024-04-17 LAB — ALPHA-GAL PANEL
Allergen Lamb IgE: <0.1 kU/L
Beef IgE: <0.1 kU/L
IgE (Immunoglobulin E), Serum: 20 [IU]/mL (ref 6–495)
O215-IgE Alpha-Gal: <0.1 kU/L
Pork IgE: <0.1 kU/L

## 2024-04-17 LAB — CHRONIC URTICARIA (CU) EVAL
ALT: 15 IU/L (ref 0–32)
Basophils Absolute: 0 x10E3/uL (ref 0.0–0.2)
Basos: 0 %
CRP: 41 mg/L — ABNORMAL HIGH (ref 0–10)
EOS (ABSOLUTE): 0.1 x10E3/uL (ref 0.0–0.4)
Eos: 1 %
Hematocrit: 47.8 % — ABNORMAL HIGH (ref 34.0–46.6)
Hemoglobin: 15.3 g/dL (ref 11.1–15.9)
Immature Grans (Abs): 0.1 x10E3/uL (ref 0.0–0.1)
Immature Granulocytes: 1 %
Lymphocytes Absolute: 2.2 x10E3/uL (ref 0.7–3.1)
Lymphs: 20 %
MCH: 27.8 pg (ref 26.6–33.0)
MCHC: 32 g/dL (ref 31.5–35.7)
MCV: 87 fL (ref 79–97)
Monocytes Absolute: 0.5 x10E3/uL (ref 0.1–0.9)
Monocytes: 5 %
Neutrophils Absolute: 8.1 x10E3/uL — ABNORMAL HIGH (ref 1.4–7.0)
Neutrophils: 73 %
Platelets: 375 x10E3/uL (ref 150–450)
Pooled Donor- BAT CU: 26.1 % — AB (ref 0.00–10.60)
RBC: 5.51 x10E6/uL — ABNORMAL HIGH (ref 3.77–5.28)
RDW: 13.5 % (ref 11.7–15.4)
Sed Rate: 16 mm/h (ref 0–40)
Thyroperoxidase Ab SerPl-aCnc: 14 [IU]/mL (ref 0–34)
WBC: 11.1 x10E3/uL — ABNORMAL HIGH (ref 3.4–10.8)

## 2024-04-17 LAB — THYROID CASCADE PROFILE: TSH: 1.39 u[IU]/mL (ref 0.450–4.500)

## 2024-04-17 LAB — TRYPTASE: Tryptase: 5.5 ug/L (ref 2.2–13.2)

## 2024-04-17 LAB — ANTINUCLEAR ANTIBODIES, IFA: ANA Titer 1: NEGATIVE

## 2024-04-18 ENCOUNTER — Telehealth: Payer: Self-pay | Admitting: *Deleted

## 2024-04-18 NOTE — Telephone Encounter (Signed)
 Called patient and advised approval for Rhapsido and denial. Will submit to [employer] bridge program and patient advised to go online for consent for same

## 2024-04-18 NOTE — Telephone Encounter (Signed)
-----   Message from Orlan CHRISTELLA Cramp sent at 04/13/2024 10:30 AM EST ----- Please start PA for Rhapsido 25mg  twice a day for CIU. Sample pack given today. Breakthrough daily hives despite being on Xolair  300mg  every 3 weeks and high dose antihistamines + singulair . Thank you.

## 2024-04-22 ENCOUNTER — Ambulatory Visit

## 2024-05-12 ENCOUNTER — Other Ambulatory Visit: Payer: Self-pay | Admitting: Allergy

## 2024-06-12 NOTE — Progress Notes (Unsigned)
 "  Follow Up Note  RE: Ashley Turner MRN: [id number] DOB: 1971/01/20 Date of Office Visit: 06/13/2024  Referring provider: Levora Reyes SAUNDERS, MD Primary care provider: Levora Reyes SAUNDERS, MD  Chief Complaint: No chief complaint on file.  History of Present Illness: I had the pleasure of seeing Ashley Turner for a follow up visit at the [facility] on 06/13/2024. She is a 54 y.o. female, who is being followed for CIU. Her previous allergy  office visit was on 04/13/2024 with Dr. Luke. Today is a regular follow up visit.  Discussed the use of AI scribe software for clinical note transcription with the patient, who gave verbal consent to proceed.  History of Present Illness            ***  Assessment and Plan: [name] is a 54 y.o. female with: Chronic idiopathic urticaria Past history - urticaria, pruritus and episodes of angioedema. No triggers. Labwork unremarkable. Unable to wean off Xolair .  Interim history - having daily hives since October even with Xolair  300mg  every 3 week injection, zyrtec , Xyzal, Pepcid  and Singulair  regimen. Prednisone  provided some relief. Interested in starting Rhapsido. Stop Xolair . Start rhapsido 25mg  twice a day - sample given.  Once your lab results are back I'll let you know and then start the medication. Will start Prior Authorization and Tammy will be in touch with you. [email or web address] Discussed Risk of Bleeding: Mucocutaneous-related bleeding occurred in 9% of patients who received RHAPSIDO. Interrupt treatment with RHAPSIDO if bleeding is observed and resume if the benefit is expected to outweigh the risk. Interrupt treatment with RHAPSIDO for 3 to 7 days pre- and post-surgery or invasive procedures. Use of antithrombotic agents concomitantly with RHAPSIDO may further increase the risk of bleeding. Consider the benefits and risks of antithrombotic agents when used with RHAPSIDO. Monitor for signs and symptoms of  bleeding. The use of live and live-attenuated vaccines should be avoided in patients receiving RHAPSIDO You may try to take allegra, zyrtec  or Xyzal twice a day. No more than 4 pills total.  If symptoms are not controlled or causes drowsiness let us  know. Continue Pepcid  (famotidine ) 20mg  twice a day.  Continue Singulair  (montelukast ) 10mg  daily at night. Continue proper skin care.  Avoid the following potential triggers: alcohol, tight clothing, NSAIDs, hot showers and getting overheated. Get bloodwork. Assessment and Plan              No follow-ups on file.  No orders of the defined types were placed in this encounter.  Lab Orders  No laboratory test(s) ordered today    Diagnostics: Spirometry:  Tracings reviewed. Her effort: {Blank single:19197::Good reproducible efforts.,It was hard to get consistent efforts and there is a question as to whether this reflects a maximal maneuver.,Poor effort, data can not be interpreted.} FVC: ***L FEV1: ***L, ***% predicted FEV1/FVC ratio: ***% Interpretation: {Blank single:19197::Spirometry consistent with mild obstructive disease,Spirometry consistent with moderate obstructive disease,Spirometry consistent with severe obstructive disease,Spirometry consistent with possible restrictive disease,Spirometry consistent with mixed obstructive and restrictive disease,Spirometry uninterpretable due to technique,Spirometry consistent with normal pattern,No overt abnormalities noted given today's efforts}.  Please see scanned spirometry results for details.  Skin Testing: {Blank single:19197::Select foods,Environmental allergy  panel,Environmental allergy  panel and select foods,Food allergy  panel,None,Deferred due to recent antihistamines use}. *** Results discussed with patient/family.   Medication List:  Current Outpatient Medications  Medication Sig Dispense Refill   amLODipine  (NORVASC ) 10 MG tablet TAKE 1  TABLET BY MOUTH EVERY DAY 90 tablet 2  CALCIUM PO Take 1 tablet by mouth daily.     cetirizine  (ZYRTEC ) 10 MG tablet Take 20 mg by mouth daily. Pt reports 20 mg am and Xyzal in PM     Cholecalciferol (VITAMIN D PO) Take 1 tablet by mouth daily.     EPINEPHrine  (AUVI-Q ) 0.3 mg/0.3 mL IJ SOAJ injection Inject 0.3 mg into the muscle as needed for anaphylaxis. 2 each 1   famotidine  (PEPCID ) 20 MG tablet Take 20 mg by mouth 2 (two) times daily.     fexofenadine (ALLEGRA) 180 MG tablet Take 180 mg by mouth daily.     HAILEY 24 FE 1-20 MG-MCG(24) tablet Take 1 tablet by mouth daily.     levocetirizine (XYZAL) 5 MG tablet Take 10 mg by mouth every evening.     montelukast  (SINGULAIR ) 10 MG tablet TAKE 1 TABLET BY MOUTH EVERYDAY AT BEDTIME 30 tablet 5   Multiple Vitamin (MULTIVITAMIN WITH MINERALS) TABS tablet Take 1 tablet by mouth daily.     omalizumab  (XOLAIR ) 300 MG/2  ML prefilled syringe Inject 300 mg into the skin every 14 (fourteen) days. 4 mL 11   Current Facility-Administered Medications  Medication Dose Route Frequency Provider Last Rate Last Admin   omalizumab  (XOLAIR ) injection 300 mg  300 mg Subcutaneous Q28 days Luke Orlan HERO, DO   300 mg at 08/28/23 9088   omalizumab  (XOLAIR ) prefilled syringe 300 mg  300 mg Subcutaneous Q28 days Luke Orlan HERO, DO   300 mg at 04/01/24 9143   Allergies: Allergies[1] I reviewed her past medical history, social history, family history, and environmental history and no significant changes have been reported from her previous visit.  Review of Systems  Constitutional:  Negative for appetite change, chills, fever and unexpected weight change.  HENT:  Negative for congestion and rhinorrhea.   Eyes:  Negative for itching.  Respiratory:  Negative for cough, chest tightness, shortness of breath and wheezing.   Cardiovascular:  Negative for chest pain.  Gastrointestinal:  Negative for abdominal pain.  Genitourinary:  Negative for difficulty  urinating.  Skin:  Positive for rash.  Allergic/Immunologic: Negative for environmental allergies and food allergies.  Neurological:  Negative for headaches.    Objective: There were no vitals taken for this visit. There is no height or weight on file to calculate BMI. Physical Exam Vitals and nursing note reviewed.  Constitutional:      Appearance: Normal appearance. She is well-developed.  HENT:     Head: Normocephalic and atraumatic.     Right Ear: External ear normal.     Left Ear: External ear normal.     Ears:     Comments: Scarring in TM b/l.    Nose: Nose normal.     Mouth/Throat:     Mouth: Mucous membranes are moist.     Pharynx: Oropharynx is clear.  Eyes:     Conjunctiva/sclera: Conjunctivae normal.  Cardiovascular:     Rate and Rhythm: Normal rate and regular rhythm.     Heart sounds: Normal heart sounds. No murmur heard.    No friction rub. No gallop.  Pulmonary:     Effort: Pulmonary effort is normal.     Breath sounds: Normal breath sounds. No wheezing or rales.  Musculoskeletal:     Cervical back: Neck supple.  Skin:    General: Skin is warm.     Findings: Rash present.     Comments: Diffuse blanchable erythematous patches on torso.  Neurological:     Mental Status:  She is alert and oriented to person, place, and time.  Psychiatric:        Behavior: Behavior normal.   Previous notes and tests were reviewed. The plan was reviewed with the patient/family, and all questions/concerned were addressed.  It was my pleasure to see Earlyne today and participate in her care. Please feel free to contact me with any questions or concerns.  Sincerely,  Orlan Cramp, DO Allergy  & Immunology  [facility]   [place] office: [phone] [facility] office: [phone]     [1] Allergies Allergen Reactions   Codeine Nausea And Vomiting  "

## 2024-06-13 ENCOUNTER — Ambulatory Visit: Admitting: Allergy

## 2024-06-13 ENCOUNTER — Other Ambulatory Visit: Payer: Self-pay

## 2024-06-13 ENCOUNTER — Encounter: Payer: Self-pay | Admitting: Allergy

## 2024-06-13 VITALS — BP 120/80 | HR 89 | Temp 98.0°F | Resp 18 | Ht 63.0 in | Wt 224.2 lb

## 2024-06-13 DIAGNOSIS — L501 Idiopathic urticaria: Secondary | ICD-10-CM | POA: Diagnosis not present

## 2024-06-13 NOTE — Patient Instructions (Addendum)
 Urticaria Continue rhapsido 25mg  twice a day.   Risk of Bleeding: Mucocutaneous-related bleeding occurred in 9% of patients who received RHAPSIDO. Interrupt treatment with RHAPSIDO if bleeding is observed and resume if the benefit is expected to outweigh the risk. Interrupt treatment with RHAPSIDO for 3 to 7 days pre- and post-surgery or invasive procedures. Use of antithrombotic agents concomitantly with RHAPSIDO may further increase the risk of bleeding. Consider the benefits and risks of antithrombotic agents when used with RHAPSIDO. Monitor for signs and symptoms of bleeding The use of live and live-attenuated vaccines should be avoided in patients receiving RHAPSIDO  You may try to take allegra, zyrtec  or Xyzal twice a day. No more than 4 pills total.  If symptoms are not controlled or causes drowsiness let us  know. Continue Pepcid  (famotidine ) 20mg  twice a day.  Continue Singulair  (montelukast ) 10mg  daily at night. Continue proper skin care.  Avoid the following potential triggers: alcohol, tight clothing, NSAIDs, hot showers and getting overheated.  Step down schedule:  Decrease zyrtec  to 10mg  in the morning and 20mg  in the evening. Continue Pepcid  20mg  twice a day and Singulair  10mg  daily. If no hives/itching for 2 weeks then: Decrease zyrtec  to 10mg  twice a day. Continue Pepcid  20mg  twice a day and Singulair  10mg  daily. If no hives/itching for 2 weeks then: Stop Singulair . Continue with zyrtec  10mg  twice a day. Continue Pepcid  20mg  twice a day. If no hives/itching for 2 weeks then: Decrease Pepcid  to 20mg  once a day. Continue with zyrtec  10mg  twice a day. If no hives/itching for 2 weeks then: Decrease zyrtec  to 10mg  once a day. Continue Pepcid  20mg  once a day. If you get hives then go back to the dose where you didn't have any symptoms.   Follow up in 6 months or sooner if needed.

## 2024-06-14 ENCOUNTER — Telehealth: Payer: Self-pay | Admitting: *Deleted

## 2024-06-14 NOTE — Telephone Encounter (Signed)
 Called patient and advised no rx needed but I have to resubmit for approval every 90 days and send in appeals for same. I am currently sending in for all those on bridge

## 2024-06-14 NOTE — Telephone Encounter (Signed)
-----   Message from Orlan Cramp, DO sent at 06/13/2024 11:13 AM EST ----- Patient on bridge program for Rhapsido and was told she needs to resubmit Rx every 90 days. Thank you.

## 2024-12-12 ENCOUNTER — Ambulatory Visit: Admitting: Allergy
